# Patient Record
Sex: Female | Born: 1960
Health system: Southern US, Community
[De-identification: ages and names within clinical notes are randomized; demographics above are authoritative.]

## PROBLEM LIST (undated history)

## (undated) DIAGNOSIS — I10 Essential (primary) hypertension: Secondary | ICD-10-CM

## (undated) HISTORY — DX: Essential (primary) hypertension: I10

---

## 2017-11-03 DIAGNOSIS — M84374A Stress fracture, right foot, initial encounter for fracture: Secondary | ICD-10-CM | POA: Diagnosis not present

## 2018-04-24 DIAGNOSIS — R05 Cough: Secondary | ICD-10-CM | POA: Diagnosis not present

## 2018-04-24 DIAGNOSIS — Z720 Tobacco use: Secondary | ICD-10-CM | POA: Diagnosis not present

## 2018-04-24 DIAGNOSIS — I10 Essential (primary) hypertension: Secondary | ICD-10-CM | POA: Diagnosis not present

## 2018-04-24 DIAGNOSIS — M79672 Pain in left foot: Secondary | ICD-10-CM | POA: Diagnosis not present

## 2018-06-16 ENCOUNTER — Other Ambulatory Visit: Payer: Self-pay | Admitting: Internal Medicine

## 2018-06-16 ENCOUNTER — Other Ambulatory Visit (HOSPITAL_COMMUNITY)
Admission: RE | Admit: 2018-06-16 | Discharge: 2018-06-16 | Disposition: A | Discharge status: Home / Self Care | Payer: 59 | Source: Ambulatory Visit | Attending: Internal Medicine | Admitting: Internal Medicine

## 2018-06-16 DIAGNOSIS — Z720 Tobacco use: Secondary | ICD-10-CM | POA: Diagnosis not present

## 2018-06-16 DIAGNOSIS — D7589 Other specified diseases of blood and blood-forming organs: Secondary | ICD-10-CM | POA: Diagnosis not present

## 2018-06-16 DIAGNOSIS — Z0000 Encounter for general adult medical examination without abnormal findings: Secondary | ICD-10-CM | POA: Diagnosis not present

## 2018-06-16 DIAGNOSIS — Z1231 Encounter for screening mammogram for malignant neoplasm of breast: Secondary | ICD-10-CM

## 2018-06-16 DIAGNOSIS — Z01411 Encounter for gynecological examination (general) (routine) with abnormal findings: Secondary | ICD-10-CM | POA: Insufficient documentation

## 2018-06-16 DIAGNOSIS — I10 Essential (primary) hypertension: Secondary | ICD-10-CM | POA: Diagnosis not present

## 2018-06-16 DIAGNOSIS — Z23 Encounter for immunization: Secondary | ICD-10-CM | POA: Diagnosis not present

## 2018-06-17 ENCOUNTER — Other Ambulatory Visit: Payer: Self-pay | Admitting: Otolaryngology

## 2018-06-20 LAB — CYTOLOGY - PAP
DIAGNOSIS: NEGATIVE
HPV: NOT DETECTED

## 2018-07-31 ENCOUNTER — Ambulatory Visit
Admission: RE | Admit: 2018-07-31 | Discharge: 2018-07-31 | Disposition: A | Discharge status: Home / Self Care | Payer: 59 | Source: Ambulatory Visit | Attending: Internal Medicine | Admitting: Internal Medicine

## 2018-07-31 DIAGNOSIS — Z1231 Encounter for screening mammogram for malignant neoplasm of breast: Secondary | ICD-10-CM | POA: Diagnosis not present

## 2019-02-12 DIAGNOSIS — K5730 Diverticulosis of large intestine without perforation or abscess without bleeding: Secondary | ICD-10-CM | POA: Diagnosis not present

## 2019-02-12 DIAGNOSIS — D122 Benign neoplasm of ascending colon: Secondary | ICD-10-CM | POA: Diagnosis not present

## 2019-02-12 DIAGNOSIS — D125 Benign neoplasm of sigmoid colon: Secondary | ICD-10-CM | POA: Diagnosis not present

## 2019-02-12 DIAGNOSIS — D123 Benign neoplasm of transverse colon: Secondary | ICD-10-CM | POA: Diagnosis not present

## 2019-02-12 DIAGNOSIS — K648 Other hemorrhoids: Secondary | ICD-10-CM | POA: Diagnosis not present

## 2019-02-12 DIAGNOSIS — Z1211 Encounter for screening for malignant neoplasm of colon: Secondary | ICD-10-CM | POA: Diagnosis not present

## 2019-02-12 DIAGNOSIS — D124 Benign neoplasm of descending colon: Secondary | ICD-10-CM | POA: Diagnosis not present

## 2019-04-18 IMAGING — MG DIGITAL SCREENING BILATERAL MAMMOGRAM WITH TOMO AND CAD
8 series · 9 of 24 positions shown · non-contrast
Comparison: None.

CLINICAL DATA: Screening.

EXAM:
DIGITAL SCREENING BILATERAL MAMMOGRAM WITH TOMO AND CAD

[L CC synth-2D]
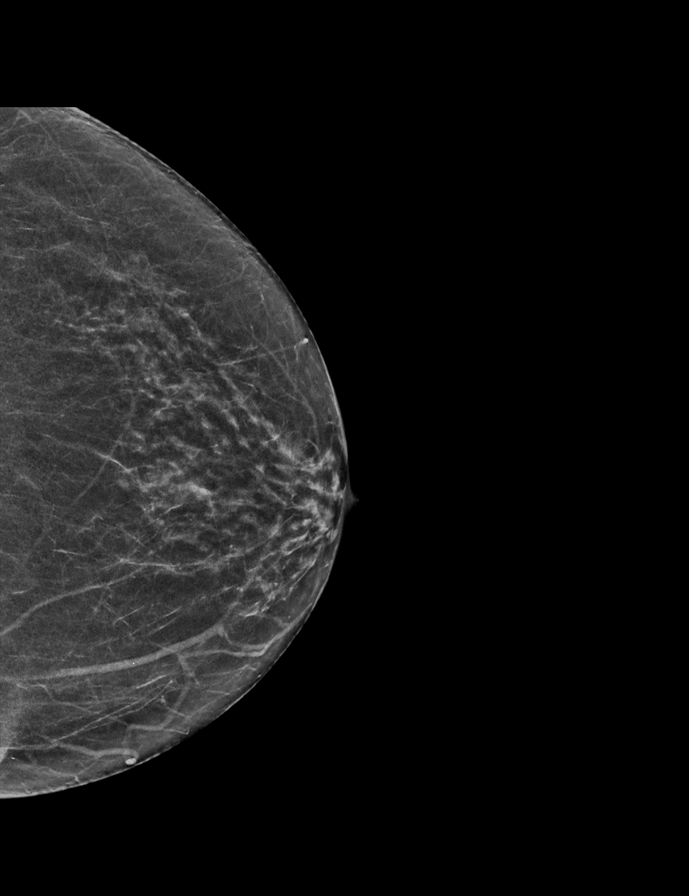

[R CC synth-2D]
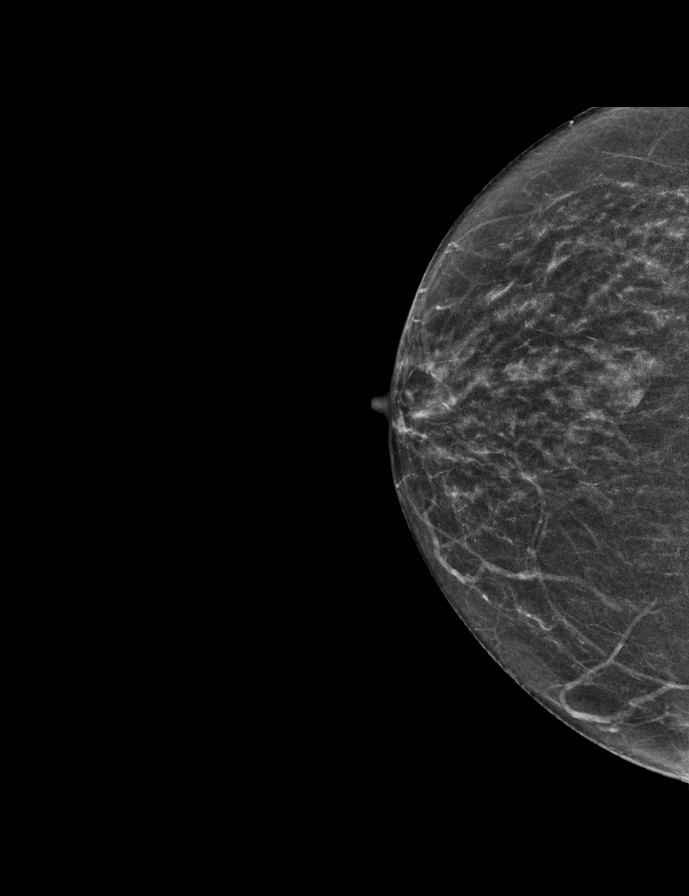

[R MLO synth-2D]
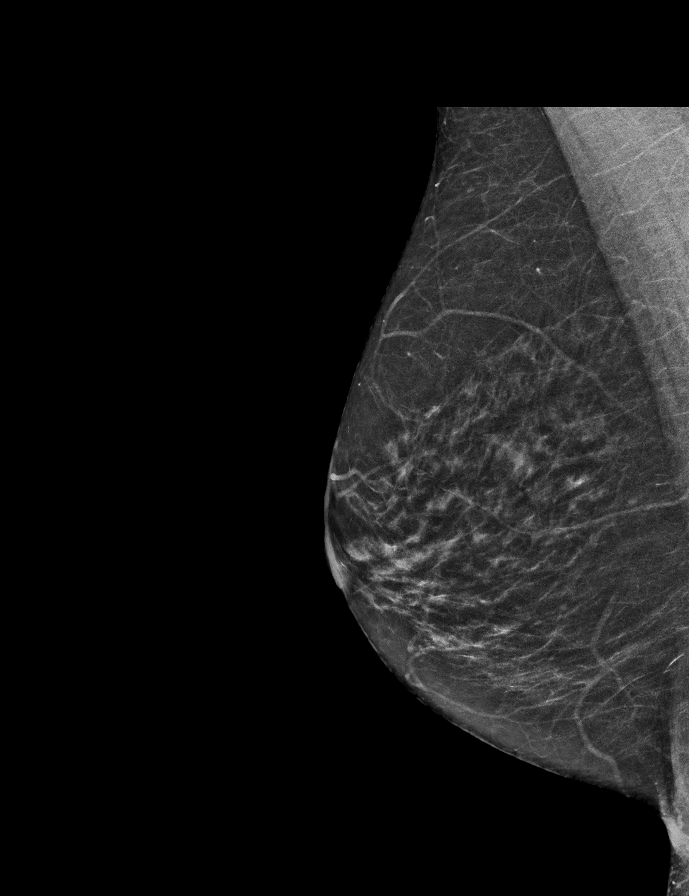

[L MLO synth-2D]
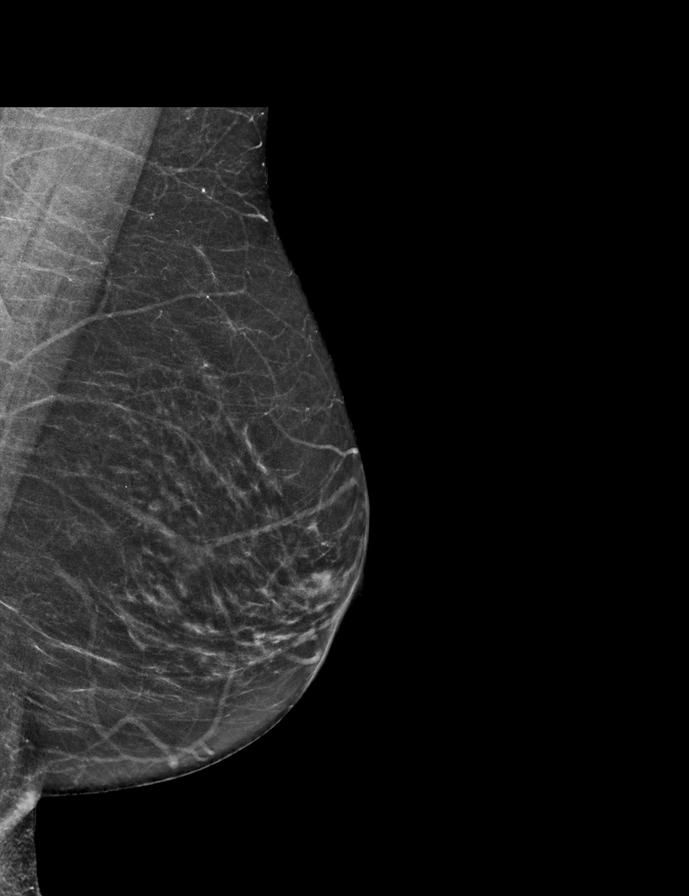

[L MLO tomo · 2 of 56 frames shown]
[frame 19/56]
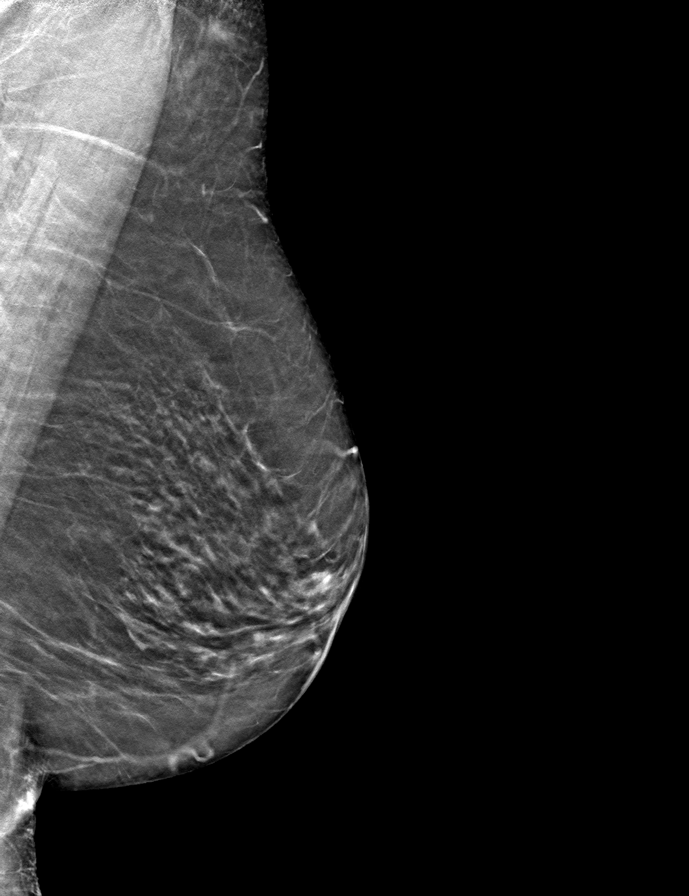
[frame 29/56]
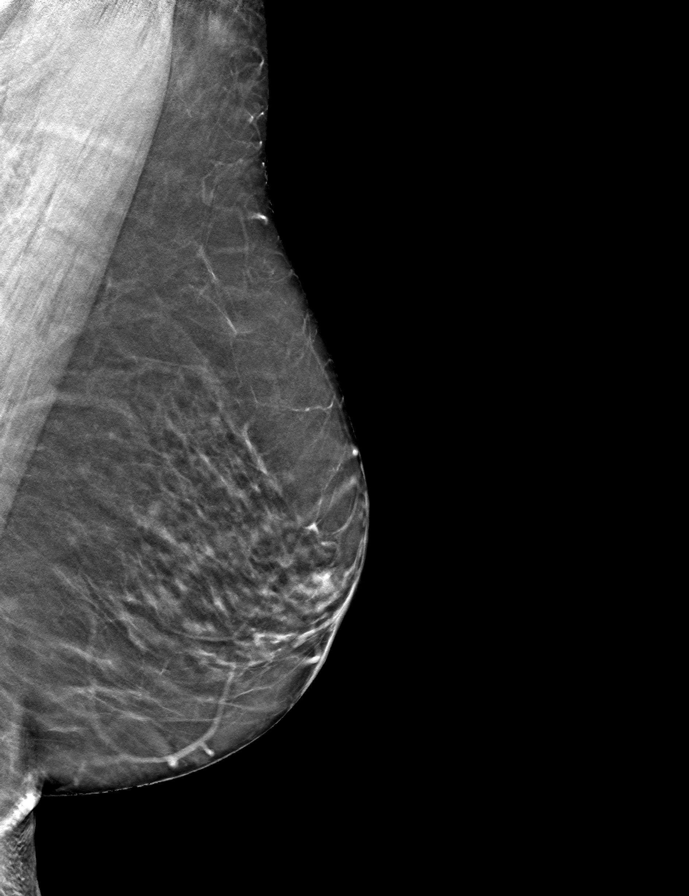

[L CC tomo · tomo slice 27/54.0]
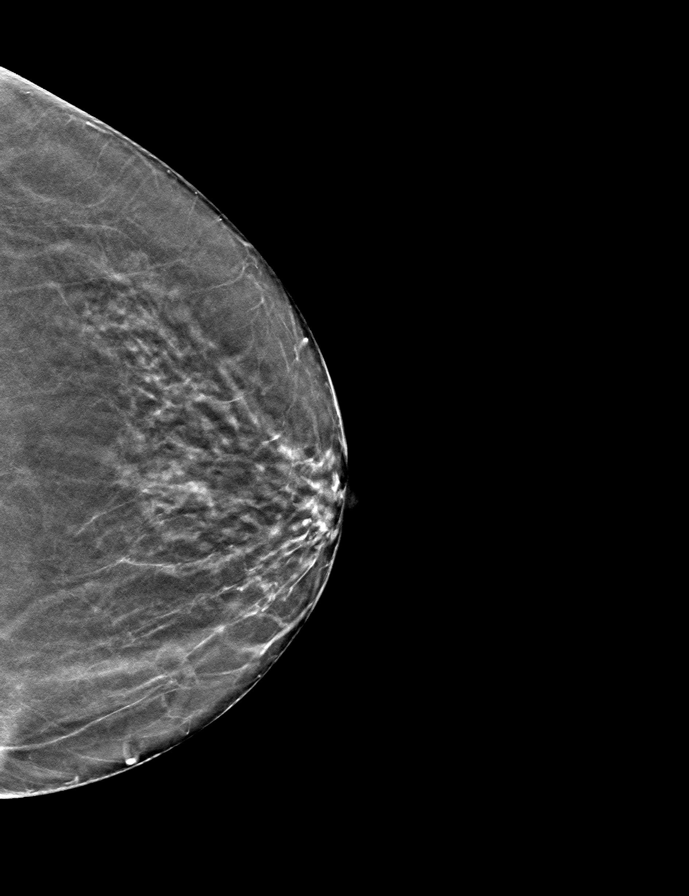

[R CC tomo · tomo slice 26/51.0]
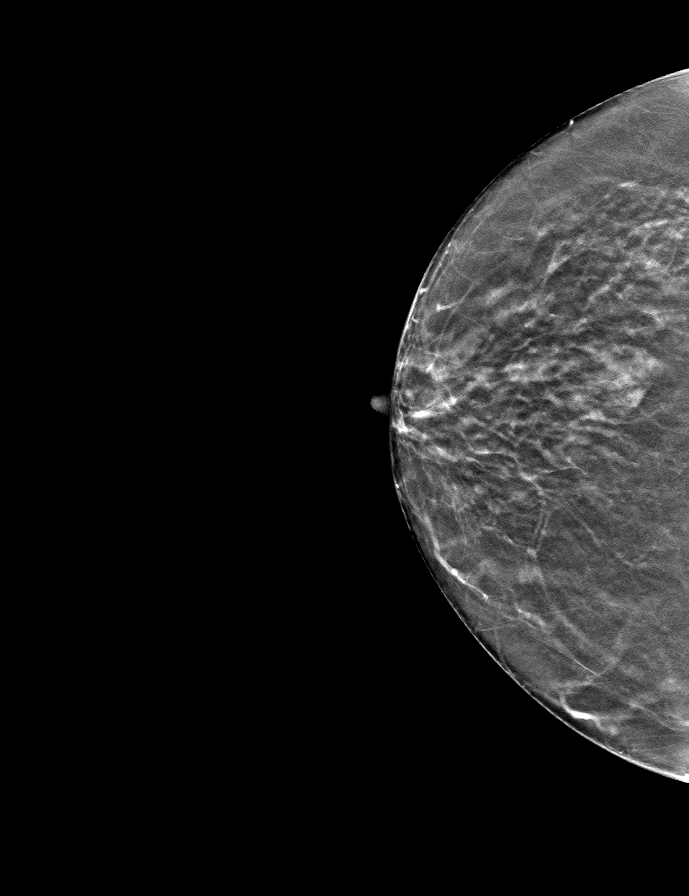

[R MLO tomo · tomo slice 27/53.0]
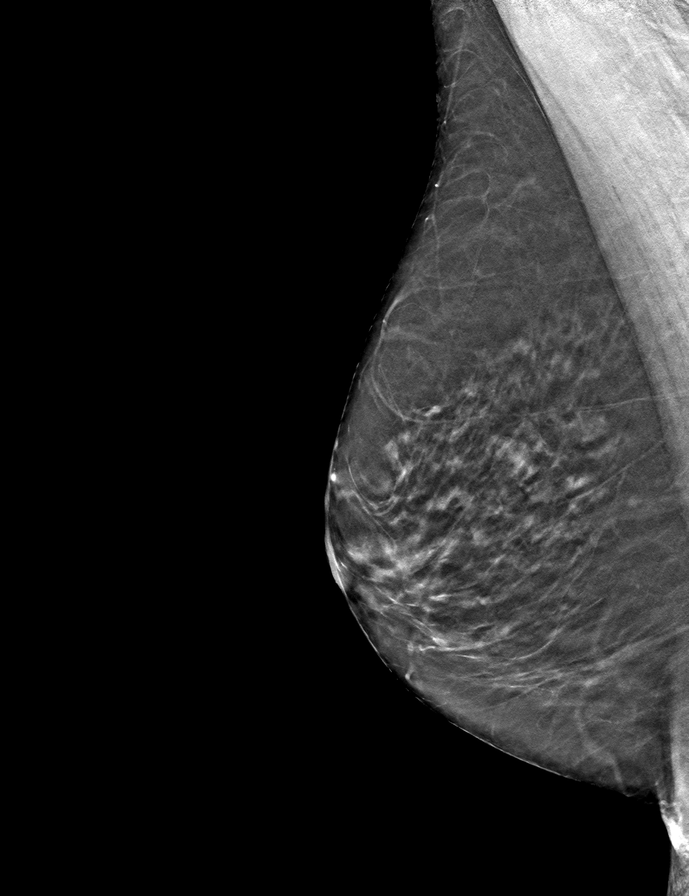

[9 of 24 positions shown; findings below may reference images not displayed]

ACR Breast Density Category b: There are scattered areas of
fibroglandular density.
FINDINGS: There are no findings suspicious for malignancy. Images were
processed with CAD.
IMPRESSION: No mammographic evidence of malignancy. A result letter of this
screening mammogram will be mailed directly to the patient.

RECOMMENDATION:
Screening mammogram in one year. (Code:[id number])

BI-RADS CATEGORY  1: Negative.

## 2019-09-01 DIAGNOSIS — Z20828 Contact with and (suspected) exposure to other viral communicable diseases: Secondary | ICD-10-CM | POA: Diagnosis not present

## 2019-09-01 DIAGNOSIS — U071 COVID-19: Secondary | ICD-10-CM | POA: Diagnosis not present

## 2019-09-01 DIAGNOSIS — Z03818 Encounter for observation for suspected exposure to other biological agents ruled out: Secondary | ICD-10-CM | POA: Diagnosis not present

## 2019-12-15 DIAGNOSIS — L03116 Cellulitis of left lower limb: Secondary | ICD-10-CM | POA: Diagnosis not present

## 2020-02-24 DIAGNOSIS — Z20822 Contact with and (suspected) exposure to covid-19: Secondary | ICD-10-CM | POA: Diagnosis not present

## 2020-02-24 DIAGNOSIS — Z03818 Encounter for observation for suspected exposure to other biological agents ruled out: Secondary | ICD-10-CM | POA: Diagnosis not present

## 2020-04-24 DIAGNOSIS — Z23 Encounter for immunization: Secondary | ICD-10-CM | POA: Diagnosis not present

## 2020-05-16 ENCOUNTER — Other Ambulatory Visit: Payer: Self-pay

## 2020-05-16 ENCOUNTER — Ambulatory Visit (INDEPENDENT_AMBULATORY_CARE_PROVIDER_SITE_OTHER): Payer: BC Managed Care – PPO | Admitting: Family Medicine

## 2020-05-16 ENCOUNTER — Encounter: Payer: Self-pay | Admitting: Family Medicine

## 2020-05-16 ENCOUNTER — Other Ambulatory Visit: Payer: Self-pay | Admitting: Family Medicine

## 2020-05-16 VITALS — BP 113/70 | HR 88 | Temp 98.0°F | Ht 62.0 in | Wt 123.4 lb

## 2020-05-16 DIAGNOSIS — L720 Epidermal cyst: Secondary | ICD-10-CM

## 2020-05-16 DIAGNOSIS — F17200 Nicotine dependence, unspecified, uncomplicated: Secondary | ICD-10-CM

## 2020-05-16 DIAGNOSIS — I10 Essential (primary) hypertension: Secondary | ICD-10-CM | POA: Diagnosis not present

## 2020-05-16 DIAGNOSIS — R739 Hyperglycemia, unspecified: Secondary | ICD-10-CM | POA: Diagnosis not present

## 2020-05-16 DIAGNOSIS — Z13220 Encounter for screening for lipoid disorders: Secondary | ICD-10-CM | POA: Diagnosis not present

## 2020-05-16 DIAGNOSIS — L989 Disorder of the skin and subcutaneous tissue, unspecified: Secondary | ICD-10-CM

## 2020-05-16 DIAGNOSIS — Z0001 Encounter for general adult medical examination with abnormal findings: Secondary | ICD-10-CM

## 2020-05-16 MED ORDER — AMLODIPINE BESYLATE 10 MG PO TABS: 10.0000 mg | ORAL_TABLET | Freq: Every day | ORAL | 3 refills | Status: DC

## 2020-05-16 NOTE — Assessment & Plan Note (Signed)
At goal.  Continue amlodipine 10 mg daily.  Check CBC, CMET, TSH.

## 2020-05-16 NOTE — Patient Instructions (Signed)
It was very nice to see you today!  We will check blood work today.  We biopsied the spot on your chest today.  This should come back within the next week or so.  I will refill your blood pressure medication.  I will see back in year for your next physical.  Please come back to see me sooner if needed.  Take care, Dr Jerline Pain  Please try these tips to maintain a healthy lifestyle:   Eat at least 3 REAL meals and 1-2 snacks per day.  Aim for no more than 5 hours between eating.  If you eat breakfast, please do so within one hour of getting up.    Each meal should contain half fruits/vegetables, one quarter protein, and one quarter carbs (no bigger than a computer mouse)   Cut down on sweet beverages. This includes juice, soda, and sweet tea.     Drink at least 1 glass of water with each meal and aim for at least 8 glasses per day   Exercise at least 150 minutes every week.    Preventive Care 50-69 Years Old, Female Preventive care refers to visits with your health care provider and lifestyle choices that can promote health and wellness. This includes:  A yearly physical exam. This may also be called an annual well check.  Regular dental visits and eye exams.  Immunizations.  Screening for certain conditions.  Healthy lifestyle choices, such as eating a healthy diet, getting regular exercise, not using drugs or products that contain nicotine and tobacco, and limiting alcohol use. What can I expect for my preventive care visit? Physical exam Your health care provider will check your:  Height and weight. This may be used to calculate body mass index (BMI), which tells if you are at a healthy weight.  Heart rate and blood pressure.  Skin for abnormal spots. Counseling Your health care provider may ask you questions about your:  Alcohol, tobacco, and drug use.  Emotional well-being.  Home and relationship well-being.  Sexual activity.  Eating habits.  Work  and work Statistician.  Method of birth control.  Menstrual cycle.  Pregnancy history. What immunizations do I need?  Influenza (flu) vaccine  This is recommended every year. Tetanus, diphtheria, and pertussis (Tdap) vaccine  You may need a Td booster every 10 years. Varicella (chickenpox) vaccine  You may need this if you have not been vaccinated. Zoster (shingles) vaccine  You may need this after age 55. Measles, mumps, and rubella (MMR) vaccine  You may need at least one dose of MMR if you were born in 1957 or later. You may also need a second dose. Pneumococcal conjugate (PCV13) vaccine  You may need this if you have certain conditions and were not previously vaccinated. Pneumococcal polysaccharide (PPSV23) vaccine  You may need one or two doses if you smoke cigarettes or if you have certain conditions. Meningococcal conjugate (MenACWY) vaccine  You may need this if you have certain conditions. Hepatitis A vaccine  You may need this if you have certain conditions or if you travel or work in places where you may be exposed to hepatitis A. Hepatitis B vaccine  You may need this if you have certain conditions or if you travel or work in places where you may be exposed to hepatitis B. Haemophilus influenzae type b (Hib) vaccine  You may need this if you have certain conditions. Human papillomavirus (HPV) vaccine  If recommended by your health care provider, you may need  three doses over 6 months. You may receive vaccines as individual doses or as more than one vaccine together in one shot (combination vaccines). Talk with your health care provider about the risks and benefits of combination vaccines. What tests do I need? Blood tests  Lipid and cholesterol levels. These may be checked every 5 years, or more frequently if you are over 54 years old.  Hepatitis C test.  Hepatitis B test. Screening  Lung cancer screening. You may have this screening every year  starting at age 62 if you have a 30-pack-year history of smoking and currently smoke or have quit within the past 15 years.  Colorectal cancer screening. All adults should have this screening starting at age 15 and continuing until age 93. Your health care provider may recommend screening at age 73 if you are at increased risk. You will have tests every 1-10 years, depending on your results and the type of screening test.  Diabetes screening. This is done by checking your blood sugar (glucose) after you have not eaten for a while (fasting). You may have this done every 1-3 years.  Mammogram. This may be done every 1-2 years. Talk with your health care provider about when you should start having regular mammograms. This may depend on whether you have a family history of breast cancer.  BRCA-related cancer screening. This may be done if you have a family history of breast, ovarian, tubal, or peritoneal cancers.  Pelvic exam and Pap test. This may be done every 3 years starting at age 40. Starting at age 60, this may be done every 5 years if you have a Pap test in combination with an HPV test. Other tests  Sexually transmitted disease (STD) testing.  Bone density scan. This is done to screen for osteoporosis. You may have this scan if you are at high risk for osteoporosis. Follow these instructions at home: Eating and drinking  Eat a diet that includes fresh fruits and vegetables, whole grains, lean protein, and low-fat dairy.  Take vitamin and mineral supplements as recommended by your health care provider.  Do not drink alcohol if: ? Your health care provider tells you not to drink. ? You are pregnant, may be pregnant, or are planning to become pregnant.  If you drink alcohol: ? Limit how much you have to 0-1 drink a day. ? Be aware of how much alcohol is in your drink. In the [place] one drink equals one 12 oz bottle of beer (355 mL), one 5 oz glass of wine (148 mL), or one 1 oz glass of  hard liquor (44 mL). Lifestyle  Take daily care of your teeth and gums.  Stay active. Exercise for at least 30 minutes on 5 or more days each week.  Do not use any products that contain nicotine or tobacco, such as cigarettes, e-cigarettes, and chewing tobacco. If you need help quitting, ask your health care provider.  If you are sexually active, practice safe sex. Use a condom or other form of birth control (contraception) in order to prevent pregnancy and STIs (sexually transmitted infections).  If told by your health care provider, take low-dose aspirin daily starting at age 9. What's next?  Visit your health care provider once a year for a well check visit.  Ask your health care provider how often you should have your eyes and teeth checked.  Stay up to date on all vaccines. This information is not intended to replace advice given to you by your  health care provider. Make sure you discuss any questions you have with your health care provider. Document Revised: 03/20/2018 Document Reviewed: 03/20/2018 Elsevier Patient Education  [place]   Tissue Adhesive Wound Care Some cuts and wounds can be closed with skin glue (tissue adhesive). Skin glue holds the skin together and helps your wound heal faster. Skin glue goes away on its own as your wound gets better. Follow these instructions at home:  Wound care  Showers are allowed 24 hours after treatment. Do not soak the wound in water. Do not take baths, swim, or use hot tubs. Do not use soaps or creams on your wound.  If a bandage (dressing) was put on the wound: ? Wash your hands with soap and water before you change your bandage. ? Change the bandage as often as told by your doctor. ? Leave skin glue in place. It will fall off on its own after 7-10 days. ? Keep the bandage dry.  Do not scratch, rub, or pick at the skin glue.  Do not put tape over the skin glue. The skin glue could come off when you take the tape  off.  Protect the wound from another injury.  Protect the wound from sun and tanning beds. General instructions  Take over-the-counter and prescription medicines only as told by your doctor.  Keep all follow-up visits as told by your doctor. This is important. Get help right away if:  Your wound is red, puffy (swollen), hot, or tender.  You get a rash after the glue is put on.  You have more pain in the wound.  You have a red streak going away from the wound.  You have yellowish-white fluid (pus) coming from the wound.  You have more bleeding.  You have a fever.  You have chills and you start to shake.  You notice a bad smell coming from the wound.  Your wound or skin glue breaks open. This information is not intended to replace advice given to you by your health care provider. Make sure you discuss any questions you have with your health care provider. Document Revised: 06/21/2017 Document Reviewed: 06/01/2016 Elsevier Patient Education  2020 [employer].

## 2020-05-16 NOTE — Progress Notes (Signed)
Chief Complaint:  Christina Hendricks is a 59 y.o. female who presents today for her annual comprehensive physical exam.  She is a new patient.   Assessment/Plan:  New/Acute Problems: Suspicious Lesion Possibly sebaceous cyst however cannot rule out melanoma or other pathologies.  Punch biopsy performed today.  See below procedure note.  She tolerated well.  Chronic Problems Addressed Today: Nicotine dependence with current use Patient was asked about her tobacco use today and was strongly advised to quit. Patient is currently noncontemplative. We reviewed treatment options to assist her quit smoking including NRT, Chantix, and Bupropion. Follow up at next office visit.   Total time spent counseling approximately 3 minutes.    Essential hypertension At goal.  Continue amlodipine 10 mg daily.  Check CBC, CMET, TSH.  Preventative Healthcare: Up-to-date on flu vaccine.  Had colonoscopy last year due for again in 2025.  Due for next Pap in 2024.  Check labs including CBC, CMET, TSH, lipid panel.  Patient Counseling(The following topics were reviewed and/or handout was given):  -Nutrition: Stressed importance of moderation in sodium/caffeine intake, saturated fat and cholesterol, caloric balance, sufficient intake of fresh fruits, vegetables, and fiber.  -Stressed the importance of regular exercise.   -Substance Abuse: Discussed cessation/primary prevention of tobacco, alcohol, or other drug use; driving or other dangerous activities under the influence; availability of treatment for abuse.   -Injury prevention: Discussed safety belts, safety helmets, smoke detector, smoking near bedding or upholstery.   -Sexuality: Discussed sexually transmitted diseases, partner selection, use of condoms, avoidance of unintended pregnancy and contraceptive alternatives.   -Dental health: Discussed importance of regular tooth brushing, flossing, and dental visits.  -Health maintenance and immunizations  reviewed. Please refer to Health maintenance section.  Return to care in 1 year for next preventative visit.     Subjective:  HPI:  She has no acute complaints today.   She has a skin lesion on her left chest that she would like to have biopsied. It has been there for months and seems to be getting bigger.    Lifestyle Diet: None specific eating plans.  Exercise: Very busy at work.   Depression screen [facility] 2/9 05/16/2020  Decreased Interest 0  Down, Depressed, Hopeless 0  PHQ - 2 Score 0  Altered sleeping 0  Tired, decreased energy 0  Change in appetite 0  Feeling bad or failure about yourself  0  Trouble concentrating 0  Moving slowly or fidgety/restless 0  Suicidal thoughts 0  PHQ-9 Score 0  Difficult doing work/chores Not difficult at all    Health Maintenance Due  Topic Date Due  . Hepatitis C Screening  Never done  . HIV Screening  Never done     ROS: Per HPI, otherwise a complete review of systems was negative.   PMH:  The following were reviewed and entered/updated in epic: Past Medical History:  Diagnosis Date  . Hypertension    Patient Active Problem List   Diagnosis Date Noted  . Essential hypertension 05/16/2020  . Nicotine dependence with current use 05/16/2020   History reviewed. No pertinent surgical history.  Family History  Problem Relation Age of Onset  . Breast cancer Neg Hx     Medications- reviewed and updated Current Outpatient Medications  Medication Sig Dispense Refill  . amLODipine (NORVASC) 10 MG tablet Take 1 tablet (10 mg total) by mouth daily. 90 tablet 3   No current facility-administered medications for this visit.    Allergies-reviewed and updated Not on File  Social History   Socioeconomic History  . Marital status: Divorced    Spouse name: Not on file  . Number of children: Not on file  . Years of education: Not on file  . Highest education level: Not on file  Occupational History  . Not on file  Tobacco  Use  . Smoking status: Current Some Day Smoker    Types: Cigarettes  Substance and Sexual Activity  . Alcohol use: Yes    Alcohol/week: 7.0 standard drinks    Types: 7 Glasses of wine per week  . Drug use: Not Currently    Types: Marijuana  . Sexual activity: Not on file  Other Topics Concern  . Not on file  Social History Narrative  . Not on file   Social Determinants of Health   Financial Resource Strain:   . Difficulty of Paying Living Expenses: Not on file  Food Insecurity:   . Worried About Programme researcher, broadcasting/film/video in the Last Year: Not on file  . Ran Out of Food in the Last Year: Not on file  Transportation Needs:   . Lack of Transportation (Medical): Not on file  . Lack of Transportation (Non-Medical): Not on file  Physical Activity:   . Days of Exercise per Week: Not on file  . Minutes of Exercise per Session: Not on file  Stress:   . Feeling of Stress : Not on file  Social Connections:   . Frequency of Communication with Friends and Family: Not on file  . Frequency of Social Gatherings with Friends and Family: Not on file  . Attends Religious Services: Not on file  . Active Member of Clubs or Organizations: Not on file  . Attends Banker Meetings: Not on file  . Marital Status: Not on file        Objective:  Physical Exam: BP 113/70   Pulse 88   Temp 98 F (36.7 C) (Temporal)   Ht 5\' 2"  (1.575 m)   Wt 123 lb 6.4 oz (56 kg)   SpO2 99%   BMI 22.57 kg/m   Body mass index is 22.57 kg/m. Wt Readings from Last 3 Encounters:  05/16/20 123 lb 6.4 oz (56 kg)   Gen: NAD, resting comfortably HEENT: TMs normal bilaterally. OP clear. No thyromegaly noted.  CV: RRR with no murmurs appreciated Pulm: NWOB, CTAB with no crackles, wheezes, or rhonchi GI: Normal bowel sounds present. Soft, Nontender, Nondistended. MSK: no edema, cyanosis, or clubbing noted Skin: warm, dry.  Pigmented lesion approximately 2 mm in diameter on left anterior chest. Neuro:  CN2-12 grossly intact. Strength 5/5 in upper and lower extremities. Reflexes symmetric and intact bilaterally.  Psych: Normal affect and thought content  Punch Biopsy Procedure Note  Pre-operative Diagnosis: Suspicious lesion  Post-operative Diagnosis: same  Locations: Left upper chest  Indications: Diagnostic   Anesthesia: 1% lidocaine with epi  Procedure Details  History of allergy to iodine: no Patient informed of the risks (including bleeding and infection) and benefits of the  procedure and Verbal informed consent obtained.  The lesion and surrounding area was given a sterile prep using betadyne and draped in the usual sterile fashion. The skin was then stretched perpendicular to the skin tension lines and the lesion removed using the 56mm punch. The resulting ellipse was then closed with tissue adhesive. Antibiotic ointment and a sterile dressing applied. The specimen was sent for pathologic examination. The patient tolerated the procedure well.  EBL: 3 ml  Condition: Stable  Complications: none.  Plan: 1. Instructed to keep the wound dry and covered for 24-48h and clean thereafter. 2. Warning signs of infection were reviewed.   3. Recommended that the patient use OTC ibuprofen as needed for pain.      Katina Degree. Jimmey Ralph, MD 05/16/2020 10:12 AM

## 2020-05-16 NOTE — Assessment & Plan Note (Signed)
Patient was asked about her tobacco use today and was strongly advised to quit. Patient is currently non contemplative. We reviewed treatment options to assist her quit smoking including NRT, Chantix, and Bupropion. Follow up at next office visit.   Total time spent counseling approximately 3 minutes.   

## 2020-05-17 LAB — COMPREHENSIVE METABOLIC PANEL
AG Ratio: 2 (calc) (ref 1.0–2.5)
ALT: 20 U/L (ref 6–29)
AST: 24 U/L (ref 10–35)
Albumin: 4.8 g/dL (ref 3.6–5.1)
Alkaline phosphatase (APISO): 85 U/L (ref 37–153)
BUN: 18 mg/dL (ref 7–25)
CO2: 25 mmol/L (ref 20–32)
Calcium: 9.8 mg/dL (ref 8.6–10.4)
Chloride: 104 mmol/L (ref 98–110)
Creat: 0.58 mg/dL (ref 0.50–1.05)
Globulin: 2.4 g/dL (calc) (ref 1.9–3.7)
Glucose, Bld: 89 mg/dL (ref 65–99)
Potassium: 4.2 mmol/L (ref 3.5–5.3)
Sodium: 138 mmol/L (ref 135–146)
Total Bilirubin: 0.5 mg/dL (ref 0.2–1.2)
Total Protein: 7.2 g/dL (ref 6.1–8.1)

## 2020-05-17 LAB — HEMOGLOBIN A1C
Hgb A1c MFr Bld: 5.4 % of total Hgb (ref <5.7)
Mean Plasma Glucose: 108 (calc)
eAG (mmol/L): 6 (calc)

## 2020-05-17 LAB — LIPID PANEL
Cholesterol: 216 mg/dL — ABNORMAL HIGH (ref <200)
HDL: 70 mg/dL (ref >=50)
LDL Cholesterol (Calc): 110 mg/dL (calc) — ABNORMAL HIGH
Non-HDL Cholesterol (Calc): 146 mg/dL (calc) — ABNORMAL HIGH (ref <130)
Total CHOL/HDL Ratio: 3.1 (calc) (ref <5.0)
Triglycerides: 239 mg/dL — ABNORMAL HIGH (ref <150)

## 2020-05-17 LAB — CBC
HCT: 41.2 % (ref 35.0–45.0)
Hemoglobin: 14.2 g/dL (ref 11.7–15.5)
MCH: 33 pg (ref 27.0–33.0)
MCHC: 34.5 g/dL (ref 32.0–36.0)
MCV: 95.8 fL (ref 80.0–100.0)
MPV: 11 fL (ref 7.5–12.5)
Platelets: 301 10*3/uL (ref 140–400)
RBC: 4.3 10*6/uL (ref 3.80–5.10)
RDW: 12.6 % (ref 11.0–15.0)
WBC: 7.4 10*3/uL (ref 3.8–10.8)

## 2020-05-17 LAB — TSH: TSH: 0.6 mIU/L (ref 0.40–4.50)

## 2020-05-19 ENCOUNTER — Encounter: Payer: Self-pay | Admitting: Family Medicine

## 2020-05-19 DIAGNOSIS — E785 Hyperlipidemia, unspecified: Secondary | ICD-10-CM | POA: Insufficient documentation

## 2020-05-19 NOTE — Progress Notes (Signed)
Please inform patient of the following:  Cholesterol is borderline but everything else is normal.  Would like her to continue working on diet and exercise and we can recheck in year.  Still waiting on pathology reports.  Can we check with labs regarding this.

## 2020-05-20 ENCOUNTER — Telehealth: Payer: Self-pay

## 2020-05-20 NOTE — Progress Notes (Signed)
Please inform patient of the following:  Path report showed benign cyst. Do not need to do any further testing.  Katina Degree. Jimmey Ralph, MD 05/20/2020 8:01 AM

## 2020-05-20 NOTE — Telephone Encounter (Signed)
Unaware of how to add comments to lab notes. However, lab result comments from Dr. Jimmey Ralph have been relayed verbatim to pt. Pt verbalized understanding.

## 2020-11-18 ENCOUNTER — Telehealth (INDEPENDENT_AMBULATORY_CARE_PROVIDER_SITE_OTHER): Payer: BC Managed Care – PPO | Admitting: Family Medicine

## 2020-11-18 ENCOUNTER — Encounter: Payer: Self-pay | Admitting: Family Medicine

## 2020-11-18 DIAGNOSIS — F419 Anxiety disorder, unspecified: Secondary | ICD-10-CM

## 2020-11-18 MED ORDER — CLONAZEPAM 0.5 MG PO TABS: 0.5000 mg | ORAL_TABLET | Freq: Two times a day (BID) | ORAL | 0 refills | Status: DC | PRN

## 2020-11-18 NOTE — Assessment & Plan Note (Signed)
Had lengthy discussion with patient regarding her anxiety today.  Symptoms are not currently controlled.  Given the relatively short-term nature of her stressors including upcoming overseas flight I think would be reasonable to have a very limited short-term course of clonazepam as this is worked well for her in the past.  We could consider SSRI if however would expect this to take at least a few weeks to see much of an effect and by that point her stressful events would be past.  I will refill clonazepam today.  She will check in with me in a couple weeks via MyChart.  Patient understands that her clonazepam would not be a long-term prescription.

## 2020-11-18 NOTE — Progress Notes (Signed)
   Christina Hendricks is a 60 y.o. female who presents today for a virtual office visit.  Assessment/Plan:  Chronic Problems Addressed Today: Anxiety Had lengthy discussion with patient regarding her anxiety today.  Symptoms are not currently controlled.  Given the relatively short-term nature of her stressors including upcoming overseas flight I think would be reasonable to have a very limited short-term course of clonazepam as this is worked well for her in the past.  We could consider SSRI if however would expect this to take at least a few weeks to see much of an effect and by that point her stressful events would be past.  I will refill clonazepam today.  She will check in with me in a couple weeks via MyChart.  Patient understands that her clonazepam would not be a long-term prescription.     Subjective:  HPI:  Patient here to discuss anxiety.  She has been under more stress recently.  She will be traveling overseas over the next couple of weeks and she is anxious about this.  She has also been dealing with a declining health of her father who is currently in hospice.  She has had issues with anxiety and panic attacks in the past.  A  few years ago she was given prescription for clonazepam which seemed to work well.  She is interested in getting another prescription today.  She thinks that once she gets through the stressful events coming up that she will be better off.  No reported SI or HI.       Objective/Observations  Physical Exam: Gen: NAD, resting comfortably Pulm: Normal work of breathing Neuro: Grossly normal, moves all extremities Psych: Normal affect and thought content  Virtual Visit via Video   I connected with Christina Hendricks on 11/18/20 at  4:00 PM EDT by a video enabled telemedicine application and verified that I am speaking with the correct person using two identifiers. The limitations of evaluation and management by telemedicine and the availability of in person  appointments were discussed. The patient expressed understanding and agreed to proceed.   Patient location: Home Provider location: [place] [employer] Persons participating in the virtual visit: Myself and Patient     Katina Degree. Jimmey Ralph, MD 11/18/2020 3:22 PM

## 2021-05-08 ENCOUNTER — Other Ambulatory Visit: Payer: Self-pay | Admitting: Family Medicine

## 2021-08-01 ENCOUNTER — Other Ambulatory Visit: Payer: Self-pay | Admitting: Family Medicine

## 2021-10-27 ENCOUNTER — Other Ambulatory Visit: Payer: Self-pay | Admitting: Family Medicine

## 2021-11-22 ENCOUNTER — Other Ambulatory Visit: Payer: Self-pay | Admitting: Family Medicine

## 2021-12-16 ENCOUNTER — Other Ambulatory Visit: Payer: Self-pay | Admitting: Family Medicine

## 2021-12-19 ENCOUNTER — Telehealth: Payer: Self-pay | Admitting: *Deleted

## 2021-12-19 ENCOUNTER — Telehealth: Payer: Self-pay | Admitting: Family Medicine

## 2021-12-19 ENCOUNTER — Other Ambulatory Visit: Payer: Self-pay | Admitting: *Deleted

## 2021-12-19 ENCOUNTER — Encounter: Payer: Self-pay | Admitting: Family Medicine

## 2021-12-19 ENCOUNTER — Ambulatory Visit: Payer: BC Managed Care – PPO | Admitting: Family Medicine

## 2021-12-19 VITALS — BP 130/70 | HR 87 | Temp 98.0°F | Ht 62.0 in | Wt 127.4 lb

## 2021-12-19 DIAGNOSIS — J40 Bronchitis, not specified as acute or chronic: Secondary | ICD-10-CM

## 2021-12-19 MED ORDER — AMOXICILLIN-POT CLAVULANATE 875-125 MG PO TABS: 1.0000 | ORAL_TABLET | Freq: Two times a day (BID) | ORAL | 0 refills | Status: DC

## 2021-12-19 MED ORDER — BENZONATATE 100 MG PO CAPS: 100.0000 mg | ORAL_CAPSULE | Freq: Three times a day (TID) | ORAL | 0 refills | Status: DC | PRN

## 2021-12-19 MED ORDER — AMLODIPINE BESYLATE 10 MG PO TABS: 10.0000 mg | ORAL_TABLET | Freq: Every day | ORAL | 0 refills | Status: DC

## 2021-12-19 MED ORDER — PREDNISONE 20 MG PO TABS: 40.0000 mg | ORAL_TABLET | Freq: Every day | ORAL | 0 refills | Status: AC

## 2021-12-19 NOTE — Patient Instructions (Signed)
It was very nice to see you today!  Augmentin-finish all of it-antibiotic Prednisone-steroids-for inflammation/cough-can stop whenever. Tessalon perles-cough.  Can continue over the counter stuff as well.    PLEASE NOTE:  If you had any lab tests please let us know if you have not heard back within a few days. You may see your results on MyChart before we have a chance to review them but we will give you a call once they are reviewed by [place]. If we ordered any referrals today, please let us know if you have not heard from their office within the next week.   Please try these tips to maintain a healthy lifestyle:  Eat most of your calories during the day when you are active. Eliminate processed foods including packaged sweets (pies, cakes, cookies), reduce intake of potatoes, white bread, white pasta, and white rice. Look for whole grain options, oat flour or almond flour.  Each meal should contain half fruits/vegetables, one quarter protein, and one quarter carbs (no bigger than a computer mouse).  Cut down on sweet beverages. This includes juice, soda, and sweet tea. Also watch fruit intake, though this is a healthier sweet option, it still contains natural sugar! Limit to 3 servings daily.  Drink at least 1 glass of water with each meal and aim for at least 8 glasses per day  Exercise at least 150 minutes every week.

## 2021-12-19 NOTE — Telephone Encounter (Signed)
Patient sch with [facility] 5/30    Patient Name: CLOTILDA HAFER [facility] Gender: Female DOB: [birth year] Age: 61 Y 3 M 24 D Return Phone Number: [phone] (Primary) Address: City/ State/ Zip: [place]  [address] Client [facility] Night - Human resources officer [facility] Night Provider Jacquiline Doe- MD Contact Type Call Who Is Calling Patient / Member / Family / Caregiver Call Type Triage / Clinical Relationship To Patient Self Return Phone Number [phone] (Primary) Chief Complaint Cough Reason for Call Symptomatic / Request for Health Information Initial Comment Caller states she has been sick for 2 weeks. It is not COVID, she has taken 2 tests and they are negative. She has a non productive cough and a sore throat. She has head congestion. Translation No Nurse Assessment Nurse: Henri Medal, RN, Amy Date/Time (Eastern Time): 12/19/2021 8:02:20 AM Confirm and document reason for call. If symptomatic, describe symptoms. ---Caller states she has been sick for 2 weeks. It is not COVID, she has taken 2 tests and they are negative. She has a non productive cough and a sore throat. She has head congestion. The cough gets so bad sometimes that she can't stop it & it brings tears to her eyes. Her ears are blocked & crackling. Throat is really dry & really sore. No fever. Does the patient have any new or worsening symptoms? ---Yes Will a triage be completed? ---Yes Related visit to physician within the last 2 weeks? ---No Does the PT have any chronic conditions? (i.e. diabetes, asthma, this includes High risk factors for pregnancy, etc.) ---No Is this a behavioral health or substance abuse call? ---No Guidelines Guideline Title Affirmed Question Affirmed Notes Nurse Date/Time (Eastern Time) Cough - Acute NonProductive [1] Nasal discharge AND [2] present > 10 days Lovelace, RN, Amy 12/19/2021 8:04:24 AM PLEASE NOTE: All  timestamps contained within this report are represented as [place] Standard Time. CONFIDENTIALTY NOTICE: This fax transmission is intended only for the addressee. It contains information that is legally privileged, confidential or otherwise protected from use or disclosure. If you are not the intended recipient, you are strictly prohibited from reviewing, disclosing, copying using or disseminating any of this information or taking any action in reliance on or regarding this information. If you have received this fax in error, please notify us immediately by telephone so that we can arrange for its return to [place]. Phone: [phone], Toll-Free: [phone], Fax: [phone] Page: 2 of 2 Call Id: [id number] Disp. Time Lamount Cohen Time) Disposition Final User 12/19/2021 8:08:11 AM SEE PCP WITHIN 3 DAYS Yes Lovelace, RN, Amy Caller Disagree/Comply Comply Caller Understands Yes PreDisposition InappropriateToAsk Care Advice Given Per Guideline SEE PCP WITHIN 3 DAYS: * You need to be seen within 2 or 3 days. * PCP VISIT: Call your doctor (or NP/PA) during regular office hours and make an appointment. A clinic or urgent care center are good places to go for care if your doctor's office is closed or you can't get an appointment. NOTE: If office will be open tomorrow, tell caller to call then, not in 3 days. COUGH MEDICINES: * COUGH SYRUP WITH DEXTROMETHORPHAN: An over-the-counter cough syrup can help your cough. The most common cough suppressant in over-the-counter cough medicines is dextromethorphan. CALL BACK IF: * Fever over 103 F (39.4 C) * Difficulty breathing occurs * You become worse CARE ADVICE given per Cough - Acute Non-Productive (Adult) guideline

## 2021-12-19 NOTE — Telephone Encounter (Signed)
Spoke with patient, notified need OV with PCP for medication refills and F/U hypertension

## 2021-12-19 NOTE — Progress Notes (Signed)
   Subjective:     Patient ID: Christina Hendricks, female    DOB: Aug 19, 1960, 61 y.o.   MRN: [id number]  Chief Complaint  Patient presents with   Cough    Cough x 2 weeks, negative Covid test, non productive, dry at times   Sore Throat    Started 2 weeks ago   Nasal Congestion    Nasal congestion, ear congestion, started 2 weeks Has taking OTC medication for sx      HPI Chief complaint: cough,congestion Symptom onset: 2 wks Pertinent positives: non productive cough x2wks, sore throat, nasal and ear congestion.  Not getting better. Mouth very dry. Pertinent negatives: covid neg x 2, no f/c. No sob Treatments tried: OTC Vaccine status: utd Sick exposure: co-worker similar.    Health Maintenance Due  Topic Date Due   HIV Screening  Never done   Hepatitis C Screening  Never done   MAMMOGRAM  07/31/2020    Past Medical History:  Diagnosis Date   Hypertension     History reviewed. No pertinent surgical history.  Outpatient Medications Prior to Visit  Medication Sig Dispense Refill   amLODipine (NORVASC) 10 MG tablet TAKE 1 TABLET BY MOUTH EVERY DAY 30 tablet 0   clonazePAM (KLONOPIN) 0.5 MG tablet Take 1 tablet (0.5 mg total) by mouth 2 (two) times daily as needed for anxiety. 30 tablet 0   No facility-administered medications prior to visit.    Allergies  Allergen Reactions   Erythromycin    ROS neg/noncontributory except as noted HPI/below      Objective:     BP 130/70   Pulse 87   Temp 98 F (36.7 C) (Temporal)   Ht 5\' 2"  (1.575 m)   Wt 127 lb 6 oz (57.8 kg)   SpO2 97%   BMI 23.30 kg/m  Wt Readings from Last 3 Encounters:  12/19/21 127 lb 6 oz (57.8 kg)  11/18/20 130 lb (59 kg)  05/16/20 123 lb 6.4 oz (56 kg)    Physical Exam   Gen: WDWN NAD HEENT: NCAT, conjunctiva not injected, sclera nonicteric TM WNL B, OP moist, no exudates   sinuses nontender NECK:  supple, no thyromegaly, no nodes, no carotid bruits CARDIAC: RRR, S1S2+, no murmur.   LUNGS: CTAB. No wheezes EXT:  no edema MSK: no gross abnormalities.  NEURO: A&O x3.  CN II-XII intact.  PSYCH: normal mood. Good eye contact     Assessment & Plan:   Problem List Items Addressed This Visit   None Visit Diagnoses     Bronchitis    -  Primary      Bronchitis-acute.  Augmentin 875 bid, pred 40, tessalon perles.  Can continue otc.   Meds ordered this encounter  Medications   amoxicillin-clavulanate (AUGMENTIN) 875-125 MG tablet    Sig: Take 1 tablet by mouth 2 (two) times daily.    Dispense:  20 tablet    Refill:  0   benzonatate (TESSALON PERLES) 100 MG capsule    Sig: Take 1 capsule (100 mg total) by mouth 3 (three) times daily as needed.    Dispense:  20 capsule    Refill:  0   predniSONE (DELTASONE) 20 MG tablet    Sig: Take 2 tablets (40 mg total) by mouth daily with breakfast for 5 days.    Dispense:  10 tablet    Refill:  0    05/18/20, MD

## 2021-12-19 NOTE — Telephone Encounter (Signed)
Noted  

## 2021-12-19 NOTE — Telephone Encounter (Signed)
Patient had an acute visit with Dr. Ruthine Dose today. Patient stated that her pharmacy contacted her and stated that her refill for amlodipine was denied. Patient stated that she know she hasn't seen Dr. Jimmey Ralph in about 2 years, but hadn't had any issues before with getting a refill. Patient would like a refill, she is stated "well I am here today"

## 2021-12-19 NOTE — Telephone Encounter (Signed)
See note

## 2022-01-19 ENCOUNTER — Other Ambulatory Visit: Payer: Self-pay | Admitting: Family Medicine

## 2022-02-16 ENCOUNTER — Other Ambulatory Visit: Payer: Self-pay | Admitting: Family Medicine

## 2022-02-23 ENCOUNTER — Other Ambulatory Visit: Payer: Self-pay | Admitting: Family Medicine

## 2022-02-23 DIAGNOSIS — Z1231 Encounter for screening mammogram for malignant neoplasm of breast: Secondary | ICD-10-CM

## 2022-03-09 ENCOUNTER — Ambulatory Visit
Admission: RE | Admit: 2022-03-09 | Discharge: 2022-03-09 | Disposition: A | Discharge status: Home / Self Care | Payer: BC Managed Care – PPO | Source: Ambulatory Visit | Attending: Family Medicine | Admitting: Family Medicine

## 2022-03-09 DIAGNOSIS — Z1231 Encounter for screening mammogram for malignant neoplasm of breast: Secondary | ICD-10-CM | POA: Diagnosis not present

## 2022-03-13 ENCOUNTER — Encounter: Payer: Self-pay | Admitting: Family Medicine

## 2022-03-13 ENCOUNTER — Ambulatory Visit (INDEPENDENT_AMBULATORY_CARE_PROVIDER_SITE_OTHER): Payer: BC Managed Care – PPO | Admitting: Family Medicine

## 2022-03-13 VITALS — BP 115/68 | HR 78 | Temp 98.0°F | Ht 62.0 in | Wt 127.8 lb

## 2022-03-13 DIAGNOSIS — I10 Essential (primary) hypertension: Secondary | ICD-10-CM

## 2022-03-13 DIAGNOSIS — Z23 Encounter for immunization: Secondary | ICD-10-CM

## 2022-03-13 DIAGNOSIS — R739 Hyperglycemia, unspecified: Secondary | ICD-10-CM | POA: Diagnosis not present

## 2022-03-13 DIAGNOSIS — Z1159 Encounter for screening for other viral diseases: Secondary | ICD-10-CM

## 2022-03-13 DIAGNOSIS — F419 Anxiety disorder, unspecified: Secondary | ICD-10-CM

## 2022-03-13 DIAGNOSIS — Z0001 Encounter for general adult medical examination with abnormal findings: Secondary | ICD-10-CM | POA: Diagnosis not present

## 2022-03-13 DIAGNOSIS — F17200 Nicotine dependence, unspecified, uncomplicated: Secondary | ICD-10-CM | POA: Diagnosis not present

## 2022-03-13 DIAGNOSIS — E785 Hyperlipidemia, unspecified: Secondary | ICD-10-CM | POA: Diagnosis not present

## 2022-03-13 DIAGNOSIS — L578 Other skin changes due to chronic exposure to nonionizing radiation: Secondary | ICD-10-CM

## 2022-03-13 DIAGNOSIS — L570 Actinic keratosis: Secondary | ICD-10-CM

## 2022-03-13 NOTE — Patient Instructions (Signed)
It was very nice to see you today!  We will get your flu shot today.  We froze a few sun damage spots on your skin today.  I will refer you to a dermatologist to take a look at the spot on your nose.  We will check blood work today.  I will refill your amlodipine.  Please let me know if you would like assistance with stopping smoking.  Please continue to work on diet and exercise.  I will see back in year for your next physical.  Please come back to see me sooner if needed.  Take care, Dr Jerline Pain  PLEASE NOTE:  If you had any lab tests please let us know if you have not heard back within a few days. You may see your results on mychart before we have a chance to review them but we will give you a call once they are reviewed by [place]. If we ordered any referrals today, please let us know if you have not heard from their office within the next week.   Please try these tips to maintain a healthy lifestyle:  Eat at least 3 REAL meals and 1-2 snacks per day.  Aim for no more than 5 hours between eating.  If you eat breakfast, please do so within one hour of getting up.   Each meal should contain half fruits/vegetables, one quarter protein, and one quarter carbs (no bigger than a computer mouse)  Cut down on sweet beverages. This includes juice, soda, and sweet tea.   Drink at least 1 glass of water with each meal and aim for at least 8 glasses per day  Exercise at least 150 minutes every week.    Preventive Care 49-42 Years Old, Female Preventive care refers to lifestyle choices and visits with your health care provider that can promote health and wellness. Preventive care visits are also called wellness exams. What can I expect for my preventive care visit? Counseling Your health care provider may ask you questions about your: Medical history, including: Past medical problems. Family medical history. Pregnancy history. Current health, including: Menstrual cycle. Method of birth  control. Emotional well-being. Home life and relationship well-being. Sexual activity and sexual health. Lifestyle, including: Alcohol, nicotine or tobacco, and drug use. Access to firearms. Diet, exercise, and sleep habits. Work and work Statistician. Sunscreen use. Safety issues such as seatbelt and bike helmet use. Physical exam Your health care provider will check your: Height and weight. These may be used to calculate your BMI (body mass index). BMI is a measurement that tells if you are at a healthy weight. Waist circumference. This measures the distance around your waistline. This measurement also tells if you are at a healthy weight and may help predict your risk of certain diseases, such as type 2 diabetes and high blood pressure. Heart rate and blood pressure. Body temperature. Skin for abnormal spots. What immunizations do I need?  Vaccines are usually given at various ages, according to a schedule. Your health care provider will recommend vaccines for you based on your age, medical history, and lifestyle or other factors, such as travel or where you work. What tests do I need? Screening Your health care provider may recommend screening tests for certain conditions. This may include: Lipid and cholesterol levels. Diabetes screening. This is done by checking your blood sugar (glucose) after you have not eaten for a while (fasting). Pelvic exam and Pap test. Hepatitis B test. Hepatitis C test. HIV (human immunodeficiency virus)  test. STI (sexually transmitted infection) testing, if you are at risk. Lung cancer screening. Colorectal cancer screening. Mammogram. Talk with your health care provider about when you should start having regular mammograms. This may depend on whether you have a family history of breast cancer. BRCA-related cancer screening. This may be done if you have a family history of breast, ovarian, tubal, or peritoneal cancers. Bone density scan. This is  done to screen for osteoporosis. Talk with your health care provider about your test results, treatment options, and if necessary, the need for more tests. Follow these instructions at home: Eating and drinking  Eat a diet that includes fresh fruits and vegetables, whole grains, lean protein, and low-fat dairy products. Take vitamin and mineral supplements as recommended by your health care provider. Do not drink alcohol if: Your health care provider tells you not to drink. You are pregnant, may be pregnant, or are planning to become pregnant. If you drink alcohol: Limit how much you have to 0-1 drink a day. Know how much alcohol is in your drink. In the [place] one drink equals one 12 oz bottle of beer (355 mL), one 5 oz glass of wine (148 mL), or one 1 oz glass of hard liquor (44 mL). Lifestyle Brush your teeth every morning and night with fluoride toothpaste. Floss one time each day. Exercise for at least 30 minutes 5 or more days each week. Do not use any products that contain nicotine or tobacco. These products include cigarettes, chewing tobacco, and vaping devices, such as e-cigarettes. If you need help quitting, ask your health care provider. Do not use drugs. If you are sexually active, practice safe sex. Use a condom or other form of protection to prevent STIs. If you do not wish to become pregnant, use a form of birth control. If you plan to become pregnant, see your health care provider for a prepregnancy visit. Take aspirin only as told by your health care provider. Make sure that you understand how much to take and what form to take. Work with your health care provider to find out whether it is safe and beneficial for you to take aspirin daily. Find healthy ways to manage stress, such as: Meditation, yoga, or listening to music. Journaling. Talking to a trusted person. Spending time with friends and family. Minimize exposure to UV radiation to reduce your risk of skin  cancer. Safety Always wear your seat belt while driving or riding in a vehicle. Do not drive: If you have been drinking alcohol. Do not ride with someone who has been drinking. When you are tired or distracted. While texting. If you have been using any mind-altering substances or drugs. Wear a helmet and other protective equipment during sports activities. If you have firearms in your house, make sure you follow all gun safety procedures. Seek help if you have been physically or sexually abused. What's next? Visit your health care provider once a year for an annual wellness visit. Ask your health care provider how often you should have your eyes and teeth checked. Stay up to date on all vaccines. This information is not intended to replace advice given to you by your health care provider. Make sure you discuss any questions you have with your health care provider. Document Revised: 01/04/2021 Document Reviewed: 01/04/2021 Elsevier Patient Education  [place].

## 2022-03-13 NOTE — Assessment & Plan Note (Signed)
Patient was asked about her tobacco use today and was strongly advised to quit. Patient is currently non contemplative. We reviewed treatment options to assist her quit smoking including NRT, Chantix, and Bupropion. Follow up at next office visit.   Total time spent counseling approximately 3 minutes.   

## 2022-03-13 NOTE — Assessment & Plan Note (Signed)
At goal today on amlodipine 10 mg daily. 

## 2022-03-13 NOTE — Assessment & Plan Note (Signed)
Symptoms are overall stable though she does have a lot of anxiety around the death and dying process.  She is interested in seeing a therapist.  We will place referral today.

## 2022-03-13 NOTE — Progress Notes (Signed)
Chief Complaint:  Christina Hendricks is a 61 y.o. female who presents today for her annual comprehensive physical exam.    Assessment/Plan:  New/Acute Problems: Actinic keratosis Cryotherapy applied today.  She tolerated well.  See below procedure note.  Chronic Problems Addressed Today: Sun-damaged skin Cryotherapy applied today to actinic keratosis.  He has a lesion on her left nostril that is concerning for possible basal cell carcinoma.  We will place referral to dermatology for further evaluation.  Anxiety Symptoms are overall stable though she does have a lot of anxiety around the death and dying process.  She is interested in seeing a therapist.  We will place referral today.  Dyslipidemia Check lipids.  Nicotine dependence with current use Patient was asked about her tobacco use today and was strongly advised to quit. Patient is currently non contemplative. We reviewed treatment options to assist her quit smoking including NRT, Chantix, and Bupropion. Follow up at next office visit.   Total time spent counseling approximately 3 minutes.    Essential hypertension At goal today on amlodipine 10 mg daily.  Preventative Healthcare: Check labs.  Flu shot given today.  Patient Counseling(The following topics were reviewed and/or handout was given):  -Nutrition: Stressed importance of moderation in sodium/caffeine intake, saturated fat and cholesterol, caloric balance, sufficient intake of fresh fruits, vegetables, and fiber.  -Stressed the importance of regular exercise.   -Substance Abuse: Discussed cessation/primary prevention of tobacco, alcohol, or other drug use; driving or other dangerous activities under the influence; availability of treatment for abuse.   -Injury prevention: Discussed safety belts, safety helmets, smoke detector, smoking near bedding or upholstery.   -Sexuality: Discussed sexually transmitted diseases, partner selection, use of condoms, avoidance of  unintended pregnancy and contraceptive alternatives.   -Dental health: Discussed importance of regular tooth brushing, flossing, and dental visits.  -Health maintenance and immunizations reviewed. Please refer to Health maintenance section.  Return to care in 1 year for next preventative visit.     Subjective:  HPI:  She has no acute complaints today. See A/p for status of chronic conditions.   Lifestyle Diet: None specific.  Exercise: Very busy with work and gets a lot of activity.      03/13/2022    2:53 PM  Depression screen PHQ 2/9  Decreased Interest 0  Down, Depressed, Hopeless 0  PHQ - 2 Score 0    There are no preventive care reminders to display for this patient.    ROS: Per HPI, otherwise a complete review of systems was negative.   PMH:  The following were reviewed and entered/updated in epic: Past Medical History:  Diagnosis Date   Hypertension    Patient Active Problem List   Diagnosis Date Noted   Sun-damaged skin 03/13/2022   Anxiety 11/18/2020   Dyslipidemia 05/19/2020   Essential hypertension 05/16/2020   Nicotine dependence with current use 05/16/2020   History reviewed. No pertinent surgical history.  Family History  Problem Relation Age of Onset   Breast cancer Neg Hx     Medications- reviewed and updated Current Outpatient Medications  Medication Sig Dispense Refill   amLODipine (NORVASC) 10 MG tablet TAKE 1 TABLET BY MOUTH EVERY DAY 30 tablet 0   No current facility-administered medications for this visit.    Allergies-reviewed and updated Allergies  Allergen Reactions   Erythromycin     Social History   Socioeconomic History   Marital status: Divorced    Spouse name: Not on file   Number of children:  2   Years of education: Not on file   Highest education level: Not on file  Occupational History   Not on file  Tobacco Use   Smoking status: Some Days    Types: Cigarettes   Smokeless tobacco: Not on file  Substance  and Sexual Activity   Alcohol use: Yes    Alcohol/week: 7.0 standard drinks of alcohol    Types: 7 Glasses of wine per week   Drug use: Not Currently    Types: Marijuana   Sexual activity: Not on file  Other Topics Concern   Not on file  Social History Narrative   2 children   Retired Runner, broadcasting/film/video.  [employer]   Social Determinants of Corporate investment banker Strain: Not on file  Food Insecurity: Not on file  Transportation Needs: Not on file  Physical Activity: Not on file  Stress: Not on file  Social Connections: Not on file        Objective:  Physical Exam: BP 115/68   Pulse 78   Temp 98 F (36.7 C) (Temporal)   Ht 5\' 2"  (1.575 m)   Wt 127 lb 12.8 oz (58 kg)   SpO2 98%   BMI 23.37 kg/m   Body mass index is 23.37 kg/m. Wt Readings from Last 3 Encounters:  03/13/22 127 lb 12.8 oz (58 kg)  12/19/21 127 lb 6 oz (57.8 kg)  11/18/20 130 lb (59 kg)   Gen: NAD, resting comfortably HEENT: TMs normal bilaterally. OP clear. No thyromegaly noted.  CV: RRR with no murmurs appreciated Pulm: NWOB, CTAB with no crackles, wheezes, or rhonchi GI: Normal bowel sounds present. Soft, Nontender, Nondistended. MSK: no edema, cyanosis, or clubbing noted Skin: warm, dry.  AK on right hand noted.  Excoriated lesion with rolled borders and telangiectasias on left nostril Neuro: CN2-12 grossly intact. Strength 5/5 in upper and lower extremities. Reflexes symmetric and intact bilaterally.  Psych: Normal affect and thought content  Cryotherapy Procedure Note  Pre-operative Diagnosis: Actinic keratosis  Locations: Right hand  Indications: Therapeutic  Procedure Details  Patient informed of risks (permanent scarring, infection, light or dark discoloration, bleeding, infection, weakness, numbness and recurrence of the lesion) and benefits of the procedure and verbal informed consent obtained.  The areas are treated with liquid nitrogen therapy, frozen until ice ball extended 3 mm  beyond lesion, allowed to thaw, and treated again.  2 lesions were treated.  The patient tolerated procedure well.  The patient was instructed on post-op care, warned that there may be blister formation, redness and pain. Recommend OTC analgesia as needed for pain. See note Condition: Stable  Complications: none.      11/20/20. Katina Degree, MD 03/13/2022 3:23 PM

## 2022-03-13 NOTE — Assessment & Plan Note (Signed)
Cryotherapy applied today to actinic keratosis.  He has a lesion on her left nostril that is concerning for possible basal cell carcinoma.  We will place referral to dermatology for further evaluation.

## 2022-03-13 NOTE — Assessment & Plan Note (Signed)
Check lipids 

## 2022-03-14 LAB — COMPREHENSIVE METABOLIC PANEL
ALT: 19 U/L (ref 0–35)
AST: 22 U/L (ref 0–37)
Albumin: 4.4 g/dL (ref 3.5–5.2)
Alkaline Phosphatase: 69 U/L (ref 39–117)
BUN: 21 mg/dL (ref 6–23)
CO2: 29 mEq/L (ref 19–32)
Calcium: 9.1 mg/dL (ref 8.4–10.5)
Chloride: 102 mEq/L (ref 96–112)
Creatinine, Ser: 1.19 mg/dL (ref 0.40–1.20)
GFR: 50.04 mL/min — ABNORMAL LOW (ref >60.00)
Glucose, Bld: 103 mg/dL — ABNORMAL HIGH (ref 70–99)
Potassium: 4.1 mEq/L (ref 3.5–5.1)
Sodium: 139 mEq/L (ref 135–145)
Total Bilirubin: 0.3 mg/dL (ref 0.2–1.2)
Total Protein: 6.7 g/dL (ref 6.0–8.3)

## 2022-03-14 LAB — TSH: TSH: 0.67 u[IU]/mL (ref 0.35–5.50)

## 2022-03-14 LAB — CBC
HCT: 37.4 % (ref 36.0–46.0)
Hemoglobin: 12.6 g/dL (ref 12.0–15.0)
MCHC: 33.8 g/dL (ref 30.0–36.0)
MCV: 95 fl (ref 78.0–100.0)
Platelets: 281 10*3/uL (ref 150.0–400.0)
RBC: 3.93 Mil/uL (ref 3.87–5.11)
RDW: 13.3 % (ref 11.5–15.5)
WBC: 8.6 10*3/uL (ref 4.0–10.5)

## 2022-03-14 LAB — LIPID PANEL
Cholesterol: 220 mg/dL — ABNORMAL HIGH (ref 0–200)
HDL: 72.6 mg/dL (ref >39.00)
NonHDL: 147.27
Total CHOL/HDL Ratio: 3
Triglycerides: 237 mg/dL — ABNORMAL HIGH (ref 0.0–149.0)
VLDL: 47.4 mg/dL — ABNORMAL HIGH (ref 0.0–40.0)

## 2022-03-14 LAB — LDL CHOLESTEROL, DIRECT: Direct LDL: 115 mg/dL

## 2022-03-14 LAB — HEMOGLOBIN A1C: Hgb A1c MFr Bld: 5.9 % (ref 4.6–6.5)

## 2022-03-14 LAB — HEPATITIS C ANTIBODY: Hepatitis C Ab: NONREACTIVE

## 2022-03-16 NOTE — Progress Notes (Signed)
Please inform patient of the following:  Her cholesterol and blood sugar are both borderline elevated but stable.  She should continue to work on diet and exercise and we can recheck in year.  Do not need to start any medications at this point.

## 2022-05-14 DIAGNOSIS — L570 Actinic keratosis: Secondary | ICD-10-CM | POA: Diagnosis not present

## 2022-05-14 DIAGNOSIS — L819 Disorder of pigmentation, unspecified: Secondary | ICD-10-CM | POA: Diagnosis not present

## 2022-05-14 DIAGNOSIS — L720 Epidermal cyst: Secondary | ICD-10-CM | POA: Diagnosis not present

## 2022-05-14 DIAGNOSIS — L820 Inflamed seborrheic keratosis: Secondary | ICD-10-CM | POA: Diagnosis not present

## 2022-05-14 DIAGNOSIS — L573 Poikiloderma of Civatte: Secondary | ICD-10-CM | POA: Diagnosis not present

## 2022-05-14 DIAGNOSIS — L821 Other seborrheic keratosis: Secondary | ICD-10-CM | POA: Diagnosis not present

## 2022-05-14 DIAGNOSIS — C44311 Basal cell carcinoma of skin of nose: Secondary | ICD-10-CM | POA: Diagnosis not present

## 2022-05-23 ENCOUNTER — Other Ambulatory Visit: Payer: Self-pay | Admitting: Family Medicine

## 2022-05-23 ENCOUNTER — Ambulatory Visit: Payer: BC Managed Care – PPO | Admitting: Sports Medicine

## 2022-05-23 VITALS — BP 110/68 | HR 76 | Ht 62.0 in | Wt 127.0 lb

## 2022-05-23 DIAGNOSIS — R0781 Pleurodynia: Secondary | ICD-10-CM | POA: Diagnosis not present

## 2022-05-23 MED ORDER — MELOXICAM 15 MG PO TABS: 15.0000 mg | ORAL_TABLET | Freq: Every day | ORAL | 0 refills | Status: DC

## 2022-05-23 NOTE — Progress Notes (Signed)
    Benito Mccreedy D.[place] [facility] Phone: [phone]   Assessment and Plan:     1. Rib pain on right side - Acute, uncomplicated, initial sports medicine visit - Most consistent with serratus anterior pulling and straining right anterior/lateral rib cage - No red flag symptoms on physical exam, so no imaging today - Start meloxicam 15 mg daily x2 weeks.  If still having pain after 2 weeks, complete 3rd-week of meloxicam. May use remaining meloxicam as needed once daily for pain control.  Do not to use additional NSAIDs while taking meloxicam.  May use Tylenol [phone] mg 2 to 3 times a day for breakthrough pain. - Avoid painful activities at work   Pertinent previous records reviewed include none   Follow Up: As needed if no improvement or worsening of symptoms.  Could obtain right-sided rib cage x-ray   Subjective:   I, [place], am serving as a Education administrator for Doctor Glennon Mac  Chief Complaint: right side rib pain   HPI:   05/23/22 Patient is a 61 year old female complaining of right sided rib pain. Patient state pain under breast, she was reaching back to grab her wallet and felt a painful pop but now its achy aleve yesterday now she has pain with all motions and taking in breaths , she thinks something might have "dislocated" no numbness or tingling,   Relevant Historical Information:  HTN  Additional pertinent review of systems negative.   Current Outpatient Medications:    amLODipine (NORVASC) 10 MG tablet, TAKE 1 TABLET BY MOUTH EVERY DAY, Disp: 30 tablet, Rfl: 0   meloxicam (MOBIC) 15 MG tablet, Take 1 tablet (15 mg total) by mouth daily., Disp: 30 tablet, Rfl: 0   Objective:     Vitals:   05/23/22 1507  BP: 110/68  Pulse: 76  SpO2: 100%  Weight: 127 lb (57.6 kg)  Height: 5\' 2"  (1.575 m)      Body mass index is 23.23 kg/m.    Physical Exam:    General: Well-appearing,  cooperative, sitting comfortably in no acute distress.  HEENT: Normocephalic, atraumatic.   Neck: No gross abnormality.  Cardiovascular: No pallor or cyanosis. Resp: Comfortable WOB.  No pain with deep inhalation or exhalation.  No pain with coughing.  Minimally tender over right-sided rib cage anterior axillary line ribs 6-7.  No flail chest.  Symmetrical inhalation and exhalation of rib cage Abdomen: Non distended.   Skin: Warm and dry; no focal rashes identified on limited exam.     Electronically signed by:  Benito Mccreedy D.[facility] 3:26 PM 05/23/22

## 2022-05-23 NOTE — Patient Instructions (Addendum)
Good to see you  - Start meloxicam 15 mg daily x2 weeks.  If still having pain after 2 weeks, complete 3rd-week of meloxicam. May use remaining meloxicam as needed once daily for pain control.  Do not to use additional NSAIDs while taking meloxicam.  May use Tylenol [phone] mg 2 to 3 times a day for breakthrough pain. Avoid painful activities at work  A needed follow up if no improvement 3-4 week follow up

## 2022-06-28 DIAGNOSIS — C44311 Basal cell carcinoma of skin of nose: Secondary | ICD-10-CM | POA: Diagnosis not present

## 2022-07-05 DIAGNOSIS — Z4802 Encounter for removal of sutures: Secondary | ICD-10-CM | POA: Diagnosis not present

## 2022-08-14 DIAGNOSIS — Z85828 Personal history of other malignant neoplasm of skin: Secondary | ICD-10-CM | POA: Diagnosis not present

## 2022-08-14 DIAGNOSIS — L578 Other skin changes due to chronic exposure to nonionizing radiation: Secondary | ICD-10-CM | POA: Diagnosis not present

## 2022-11-15 ENCOUNTER — Other Ambulatory Visit: Payer: Self-pay | Admitting: Family Medicine

## 2022-12-11 ENCOUNTER — Telehealth: Payer: Self-pay | Admitting: Family Medicine

## 2022-12-11 NOTE — Telephone Encounter (Signed)
Please advise 

## 2022-12-11 NOTE — Telephone Encounter (Signed)
Pt had a referral from last year lapse and would like a new one sent to a Psychology office. Please advise.

## 2022-12-20 ENCOUNTER — Other Ambulatory Visit: Payer: Self-pay | Admitting: *Deleted

## 2022-12-20 DIAGNOSIS — F419 Anxiety disorder, unspecified: Secondary | ICD-10-CM

## 2022-12-20 NOTE — Telephone Encounter (Signed)
Patient called for an update. Requests a referral as soon as possible.

## 2022-12-20 NOTE — Telephone Encounter (Signed)
Patient aware referral placed. 

## 2023-01-22 ENCOUNTER — Ambulatory Visit: Payer: BC Managed Care – PPO | Admitting: Licensed Clinical Social Worker

## 2023-01-29 ENCOUNTER — Other Ambulatory Visit: Payer: Self-pay | Admitting: Family Medicine

## 2023-01-29 DIAGNOSIS — Z1231 Encounter for screening mammogram for malignant neoplasm of breast: Secondary | ICD-10-CM

## 2023-01-31 ENCOUNTER — Encounter: Payer: Self-pay | Admitting: Licensed Clinical Social Worker

## 2023-01-31 ENCOUNTER — Ambulatory Visit (INDEPENDENT_AMBULATORY_CARE_PROVIDER_SITE_OTHER): Payer: BC Managed Care – PPO | Admitting: Licensed Clinical Social Worker

## 2023-01-31 DIAGNOSIS — F419 Anxiety disorder, unspecified: Secondary | ICD-10-CM | POA: Diagnosis not present

## 2023-01-31 NOTE — Progress Notes (Signed)
[facility] Counselor/Therapist Progress Note  Patient ID: Christina Hendricks, MRN: [id number]    Date: 01/31/23  Time Spent: 120  pm - 0220 pm : 60 Minutes  Treatment Type: Individual Therapy/Assessment/TX PLAN  Reported Symptoms: PT reports symptoms of anxiety related to aging and dying.  Mental Status Exam: Appearance:  Well Groomed     Behavior: Appropriate  Motor: Normal  Speech/Language:  Clear and Coherent  Affect: Flat  Mood: irritable  Thought process: normal  Thought content:   WNL  Sensory/Perceptual disturbances:   WNL  Orientation: oriented to person, place, time/date, situation, day of week, month of year, and year  Attention: Good  Concentration: Good  Memory: WNL  Fund of knowledge:  Good  Insight:   Good  Judgment:  Good  Impulse Control: Good   Risk Assessment: Danger to Self:  No Self-injurious Behavior: No Danger to Others: No Duty to Warn:no Physical Aggression / Violence:No  Access to Firearms a concern: No  Gang Involvement:No   Subjective:   Christina Hendricks participated in completion of assessment and development of treatment plan and goals and consented to treatment. Patient presented on time for her scheduled appointment and engaged in discussion. PT was dressed appropriately in casual attire and was neat and clean in her appearance.    Patient identified a constant fear of aging an dying and due to this identified an increase in symptoms of anxiety including not being able to control her worry and obsessive thoughts related to dying and aging as well as a fear that something bad may happen. Patient completed the GAD-7 and scored a 5 indicating mild anxiety. Patient completed the PHQ-9 Depression Scale and scored a 0.  Interventions: Cognitive Behavioral Therapy  Diagnosis: Anxiety Disorder NOS   Presenting Problem Chief Complaint: PT reports an excessive and ongoing fear of aging and death. PT reports that this has been ongoing  since she was a small child. PT reports that in the past 2 years it has became a daily stressor and feels it has began to affect her daily functioning.  What are the main stressors in your life right now, how long? Depression  3, Anxiety   3, Racing Thoughts   3, Excessive Worrying   3, and Obsessive Thoughts   3   Previous mental health services Have you ever been treated for a mental health problem, when, where, by whom? No  NA   Are you currently seeing a therapist or counselor, counselor's name? No NA  Have you ever had a mental health hospitalization, how many times, length of stay? No NA  Have you ever been treated with medication, name, reason, response? Yes PT reports being treated with anxiety medication many years ago but unable to recall the medication name and provider.  Have you ever had suicidal thoughts or attempted suicide, when, how? No NA  Risk factors for Suicide Demographic factors:  Living alone Current mental status: No plan to harm self or others Loss factors: Decrease in vocational status Historical factors: NA Risk Reduction factors: Sense of responsibility to family Clinical factors:  Severe Anxiety and/or Agitation Cognitive features that contribute to risk: NA    SUICIDE RISK:  Minimal: No identifiable suicidal ideation.  Patients presenting with no risk factors but with morbid ruminations; may be classified as minimal risk based on the severity of the depressive symptoms  Medical history Medical treatment and/or problems, explain: No PT denied medical issues but has agreed to make an appointment with PCP  to get a wellness check up. Do you have any issues with chronic pain?  No NA Name of primary care physician/last physical exam: Dr. Jacquiline Hendricks, PT reports that she will make an appointment for a wellness visit.   Allergies: Yes Medication, reactions? PT reports an allergy to arrythamicin   Current medications: PT reports taking medications for high  BP Prescribed by: DR> Christina Hendricks Is there any history of mental health problems or substance abuse in your family, whom? No NA Has anyone in your family been hospitalized, who, where, length of stay? No NA  Social/family history Have you been married, how many times?  1  Do you have children?  2  How many pregnancies have you had?  2  Who lives in your current household? PT reports living alone  Military history: No NA  Religious/spiritual involvement: NA What religion/faith base are you? Quaker  Family of origin (childhood history) PT reports that her family consisted of her Mother, Father and 2 siblings and herself.  Where were you born? [place] Where did you grow up? PT reports growing up all over the [place] primarily in [place] and [place]. How many different homes have you lived? PT reports living in over 12 homes in her lifetime.  Describe the atmosphere of the household where you grew up: PT reports her home as being "Fantastic" and denied Abuse/Trauma in the home. Do you have siblings, step/half siblings, list names, relation, sex, age? Yes BIO- Brother-Christina Hendricks age 16, Sister-Christina Hendricks age 49  Are your parents separated/divorced, when and why? No Parents married fo rover 70 years  Are your parents alive? Yes Mother is alive and Father is deceased.  Social supports (personal and professional): Sister, daughter and friends.  Education How many grades have you completed? post college graduate work or degree Did you have any problems in school, what type? No NA Medications prescribed for these problems? No NA  Employment (financial issues) PT denied financial stressors.   Legal history: PT denied past or current legal issues.   Trauma/Abuse history:PT denied Have you ever been exposed to any form of abuse, what type? No NA  Have you ever been exposed to something traumatic, describe? 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  Substance  use Do you use Caffeine? Yes Type, frequency? 1 cup daily  Do you use Nicotine? Yes Type, frequency, ppd? 10 cigarettes daily   Do you use Alcohol? Yes Type, frequency? 3 glasses of liquor daily  How old were you went you first tasted alcohol? PT reports being a teenager when she first tasted alcohol. Was this accepted by your family? Yes  When was your last drink, type, how much? Last night a glass of liquor  Have you ever used illicit drugs or taken more than prescribed, type, frequency, date of last usage? Yes PT reports experimenting with Marijuana and cocaine in the 70's and 80's   Diagnosis AXIS I Anxiety Disorder NOS  AXIS II No diagnosis  AXIS III @[facility] @  AXIS IV problems with primary support group  AXIS V 51-60 moderate symptoms   Treatment Plan:  PT reports stress and anxiety related to her constant fear and dwelling on the idea of aging and dying. PT reports that this negatively impacts her daily functioning     Client Abilities/Strengths: PT is educated and a retired Runner, broadcasting/film/video. PT is well traveled and insightful.  Support System: Friends, sister and daughter   Client Treatment Preferences Cognitive Behavioral  Therapy  Client Statement of Needs       "I want to improve my relationship with my children and to stop worrying so much about aging and dying."  Treatment Level Moderate  Symptoms: Excessive worry and fear  Goals:  "I want to improve my relationship with my children and to stop worrying so much about aging and dying."   Target Date: 08/03/2023 Frequency: weekly  Progress: 0 Modality: individual    Therapist will provide referrals for additional resources as appropriate.  Therapist will provide psycho-education regarding anxiety and depression related to her excessive fear and worry of death and dying, and corresponding treatment approaches and interventions. Licensed Clinical Social Worker, Phyllis Ginger, LCSW will support the patient's ability to  achieve the goals identified. will employ CBT, BA, Problem-solving, Solution Focused, Mindfulness,  coping skills, & other evidenced-based practices will be used to promote progress towards healthy functioning to help manage and decrease symptoms associated with her diagnosis.   Reduce overall level, frequency, and intensity of the feelings of anxiety and panic evidenced by decreased from 6 to 7 days/week to 0 to 2 days/week per client report for at least 3 consecutive months. Verbally express understanding of the relationship between feelings of worry and  anxiety and their impact on thinking patterns and behaviors. Verbalize an understanding of the role that distorted thinking plays in creating fears, excessive worry, and ruminations.            Christina Hendricks participated in the creation of the treatment plan.  Phyllis Ginger MSW, LCSW  __________________________________Emily Sherron Hendricks MSW, LCSW DATE: 01/31/2023            Plan: Christina Hendricks is to use CBT, mindfulness and coping skills to help manage decrease symptoms associated with their diagnosis.   Long-term goal:   Christina Hendricks will reduce overall level, frequency, and intensity of the feelings of anxiety and panic evidenced by decreased worry and obsessive thoughts, negative self talk, and helpless feelings from 6 to 7 days/week to 0 to 2 days/week per client report for at least 3 consecutive months.  Short-term goal:  Christina Hendricks will verbally express understanding of the relationship between feelings of worry, fear and anxiety and their impact on thinking patterns and behaviors. Verbalize an understanding of the role that distorted thinking plays in creating fears, excessive worry, and ruminations.  Phyllis Ginger MSW, LCSW DATE 01/31/2023

## 2023-02-14 ENCOUNTER — Ambulatory Visit (INDEPENDENT_AMBULATORY_CARE_PROVIDER_SITE_OTHER): Payer: BC Managed Care – PPO | Admitting: Licensed Clinical Social Worker

## 2023-02-14 ENCOUNTER — Encounter: Payer: Self-pay | Admitting: Licensed Clinical Social Worker

## 2023-02-14 DIAGNOSIS — F419 Anxiety disorder, unspecified: Secondary | ICD-10-CM

## 2023-02-14 NOTE — Progress Notes (Signed)
[facility] Counselor/Therapist Progress Note  Patient ID: Christina Hendricks, MRN: [id number]    Date: 02/14/23  Time Spent: 0100  pm - 0200 pm : 60 Minutes  Treatment Type: Individual Therapy.  Reported Symptoms: Symptoms of anxiety related to fear of aging and dying  Mental Status Exam: Appearance:  Casual     Behavior: Appropriate  Motor: Normal  Speech/Language:  Clear and Coherent  Affect: Appropriate  Mood: normal  Thought process: normal  Thought content:   WNL  Sensory/Perceptual disturbances:   WNL  Orientation: oriented to person, place, time/date, situation, day of week, month of year, and year  Attention: Good  Concentration: Good  Memory: WNL  Fund of knowledge:  Good  Insight:   Good  Judgment:  Good  Impulse Control: Good   Risk Assessment: Danger to Self:  No Self-injurious Behavior: No Danger to Others: No Duty to Warn:no Physical Aggression / Violence:No  Access to Firearms a concern: No  Gang Involvement:No   Subjective:   Christina Hendricks participated from office with Clinician present and consented to treatment. Therapist participated from.   Patient presented on time for her scheduled session. Patient was polite and cooperative. Patient identified that she feels consumed with thoughts of aging and dying. Patient identified that her biggest fear is dying but also not being healthy enough to do the things that she enjoys. 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 Patient is insightful and motivated and desires to make healthy improvements in her overall well being.   Interventions: Cognitive Behavioral Therapy  Diagnosis: Anxiety   Plan: Christina Hendricks is to use CBT, mindfulness and coping skills to help manage decrease symptoms associated with their diagnosis.   Long-term goal:   Christina Hendricks will reduce overall level,  frequency, and intensity of the feelings of depression, anxiety and panic evidenced by       decreased irritability, negative self talk, and helpless feelings from 6 to 7 days/week to 0 to 2 days/week per client report for at least 3 consecutive months. Treatment plan to be reviewed by 01/31/2024.  Short-term goal:   Christina Hendricks will verbally express understanding of the relationship between feelings of depression, anxiety and their impact on thinking patterns and behaviors. Verbalize an understanding of the role that distorted thinking plays in creating fears, excessive worry, and ruminations.  Phyllis Ginger MSW, LCSW DATE: 02/14/2023

## 2023-02-28 ENCOUNTER — Ambulatory Visit: Payer: BC Managed Care – PPO | Admitting: Licensed Clinical Social Worker

## 2023-03-07 ENCOUNTER — Encounter (INDEPENDENT_AMBULATORY_CARE_PROVIDER_SITE_OTHER): Payer: Self-pay

## 2023-03-12 ENCOUNTER — Ambulatory Visit
Admission: RE | Admit: 2023-03-12 | Discharge: 2023-03-12 | Disposition: A | Discharge status: Home / Self Care | Payer: BC Managed Care – PPO | Source: Ambulatory Visit | Attending: Family Medicine | Admitting: Family Medicine

## 2023-03-12 ENCOUNTER — Ambulatory Visit: Payer: BC Managed Care – PPO

## 2023-03-12 DIAGNOSIS — Z1231 Encounter for screening mammogram for malignant neoplasm of breast: Secondary | ICD-10-CM | POA: Diagnosis not present

## 2023-04-02 ENCOUNTER — Ambulatory Visit (INDEPENDENT_AMBULATORY_CARE_PROVIDER_SITE_OTHER): Payer: BC Managed Care – PPO | Admitting: Family Medicine

## 2023-04-02 ENCOUNTER — Encounter: Payer: Self-pay | Admitting: Family Medicine

## 2023-04-02 ENCOUNTER — Other Ambulatory Visit (HOSPITAL_COMMUNITY)
Admission: RE | Admit: 2023-04-02 | Discharge: 2023-04-02 | Disposition: A | Discharge status: Home / Self Care | Payer: BC Managed Care – PPO | Source: Ambulatory Visit | Attending: Family Medicine | Admitting: Family Medicine

## 2023-04-02 VITALS — BP 120/68 | HR 72 | Temp 97.5°F | Ht 62.0 in | Wt 125.8 lb

## 2023-04-02 DIAGNOSIS — L578 Other skin changes due to chronic exposure to nonionizing radiation: Secondary | ICD-10-CM

## 2023-04-02 DIAGNOSIS — R739 Hyperglycemia, unspecified: Secondary | ICD-10-CM | POA: Diagnosis not present

## 2023-04-02 DIAGNOSIS — F17200 Nicotine dependence, unspecified, uncomplicated: Secondary | ICD-10-CM | POA: Diagnosis not present

## 2023-04-02 DIAGNOSIS — F17210 Nicotine dependence, cigarettes, uncomplicated: Secondary | ICD-10-CM

## 2023-04-02 DIAGNOSIS — Z0001 Encounter for general adult medical examination with abnormal findings: Secondary | ICD-10-CM

## 2023-04-02 DIAGNOSIS — I10 Essential (primary) hypertension: Secondary | ICD-10-CM

## 2023-04-02 DIAGNOSIS — Z0000 Encounter for general adult medical examination without abnormal findings: Secondary | ICD-10-CM

## 2023-04-02 DIAGNOSIS — F419 Anxiety disorder, unspecified: Secondary | ICD-10-CM | POA: Diagnosis not present

## 2023-04-02 DIAGNOSIS — E785 Hyperlipidemia, unspecified: Secondary | ICD-10-CM

## 2023-04-02 DIAGNOSIS — Z23 Encounter for immunization: Secondary | ICD-10-CM | POA: Diagnosis not present

## 2023-04-02 DIAGNOSIS — Z124 Encounter for screening for malignant neoplasm of cervix: Secondary | ICD-10-CM

## 2023-04-02 LAB — LIPID PANEL
Cholesterol: 219 mg/dL — ABNORMAL HIGH (ref 0–200)
HDL: 86.9 mg/dL (ref >39.00)
LDL Cholesterol: 105 mg/dL — ABNORMAL HIGH (ref 0–99)
NonHDL: 132.16
Total CHOL/HDL Ratio: 3
Triglycerides: 137 mg/dL (ref 0.0–149.0)
VLDL: 27.4 mg/dL (ref 0.0–40.0)

## 2023-04-02 LAB — COMPREHENSIVE METABOLIC PANEL
ALT: 23 U/L (ref 0–35)
AST: 25 U/L (ref 0–37)
Albumin: 4.4 g/dL (ref 3.5–5.2)
Alkaline Phosphatase: 79 U/L (ref 39–117)
BUN: 14 mg/dL (ref 6–23)
CO2: 28 meq/L (ref 19–32)
Calcium: 9.4 mg/dL (ref 8.4–10.5)
Chloride: 104 meq/L (ref 96–112)
Creatinine, Ser: 0.63 mg/dL (ref 0.40–1.20)
GFR: 96.3 mL/min (ref >60.00)
Glucose, Bld: 91 mg/dL (ref 70–99)
Potassium: 3.7 meq/L (ref 3.5–5.1)
Sodium: 139 meq/L (ref 135–145)
Total Bilirubin: 0.5 mg/dL (ref 0.2–1.2)
Total Protein: 7.1 g/dL (ref 6.0–8.3)

## 2023-04-02 LAB — CBC
HCT: 39.6 % (ref 36.0–46.0)
Hemoglobin: 13.2 g/dL (ref 12.0–15.0)
MCHC: 33.4 g/dL (ref 30.0–36.0)
MCV: 97 fl (ref 78.0–100.0)
Platelets: 301 10*3/uL (ref 150.0–400.0)
RBC: 4.08 Mil/uL (ref 3.87–5.11)
RDW: 13.9 % (ref 11.5–15.5)
WBC: 7.5 10*3/uL (ref 4.0–10.5)

## 2023-04-02 LAB — TSH: TSH: 0.6 u[IU]/mL (ref 0.35–5.50)

## 2023-04-02 LAB — HEMOGLOBIN A1C: Hgb A1c MFr Bld: 5.8 % (ref 4.6–6.5)

## 2023-04-02 MED ORDER — CLONAZEPAM 0.5 MG PO TABS: 0.5000 mg | ORAL_TABLET | Freq: Two times a day (BID) | ORAL | 1 refills | Status: DC | PRN

## 2023-04-02 NOTE — Patient Instructions (Addendum)
It was very nice to see you today!  We will check blood work today.  Please continue to work on diet and exercise.  I will refill your clonazepam today.  I will also refer you to see a psychologist.  We completed your Pap smear today.  Will see back in year for next physical.  Come back sooner if needed.  Return in about 1 year (around 04/01/2024) for Annual Physical.   Take care, Dr Jimmey Ralph  PLEASE NOTE:  If you had any lab tests, please let us know if you have not heard back within a few days. You may see your results on mychart before we have a chance to review them but we will give you a call once they are reviewed by [place].   If we ordered any referrals today, please let us know if you have not heard from their office within the next week.   If you had any urgent prescriptions sent in today, please check with the pharmacy within an hour of our visit to make sure the prescription was transmitted appropriately.   Please try these tips to maintain a healthy lifestyle:  Eat at least 3 REAL meals and 1-2 snacks per day.  Aim for no more than 5 hours between eating.  If you eat breakfast, please do so within one hour of getting up.   Each meal should contain half fruits/vegetables, one quarter protein, and one quarter carbs (no bigger than a computer mouse)  Cut down on sweet beverages. This includes juice, soda, and sweet tea.   Drink at least 1 glass of water with each meal and aim for at least 8 glasses per day  Exercise at least 150 minutes every week.    Preventive Care 48-63 Years Old, Female Preventive care refers to lifestyle choices and visits with your health care provider that can promote health and wellness. Preventive care visits are also called wellness exams. What can I expect for my preventive care visit? Counseling Your health care provider may ask you questions about your: Medical history, including: Past medical problems. Family medical history. Pregnancy  history. Current health, including: Menstrual cycle. Method of birth control. Emotional well-being. Home life and relationship well-being. Sexual activity and sexual health. Lifestyle, including: Alcohol, nicotine or tobacco, and drug use. Access to firearms. Diet, exercise, and sleep habits. Work and work Astronomer. Sunscreen use. Safety issues such as seatbelt and bike helmet use. Physical exam Your health care provider will check your: Height and weight. These may be used to calculate your BMI (body mass index). BMI is a measurement that tells if you are at a healthy weight. Waist circumference. This measures the distance around your waistline. This measurement also tells if you are at a healthy weight and may help predict your risk of certain diseases, such as type 2 diabetes and high blood pressure. Heart rate and blood pressure. Body temperature. Skin for abnormal spots. What immunizations do I need?  Vaccines are usually given at various ages, according to a schedule. Your health care provider will recommend vaccines for you based on your age, medical history, and lifestyle or other factors, such as travel or where you work. What tests do I need? Screening Your health care provider may recommend screening tests for certain conditions. This may include: Lipid and cholesterol levels. Diabetes screening. This is done by checking your blood sugar (glucose) after you have not eaten for a while (fasting). Pelvic exam and Pap test. Hepatitis B test. Hepatitis C  test. HIV (human immunodeficiency virus) test. STI (sexually transmitted infection) testing, if you are at risk. Lung cancer screening. Colorectal cancer screening. Mammogram. Talk with your health care provider about when you should start having regular mammograms. This may depend on whether you have a family history of breast cancer. BRCA-related cancer screening. This may be done if you have a family history of  breast, ovarian, tubal, or peritoneal cancers. Bone density scan. This is done to screen for osteoporosis. Talk with your health care provider about your test results, treatment options, and if necessary, the need for more tests. Follow these instructions at home: Eating and drinking  Eat a diet that includes fresh fruits and vegetables, whole grains, lean protein, and low-fat dairy products. Take vitamin and mineral supplements as recommended by your health care provider. Do not drink alcohol if: Your health care provider tells you not to drink. You are pregnant, may be pregnant, or are planning to become pregnant. If you drink alcohol: Limit how much you have to 0-1 drink a day. Know how much alcohol is in your drink. In the [place] one drink equals one 12 oz bottle of beer (355 mL), one 5 oz glass of wine (148 mL), or one 1 oz glass of hard liquor (44 mL). Lifestyle Brush your teeth every morning and night with fluoride toothpaste. Floss one time each day. Exercise for at least 30 minutes 5 or more days each week. Do not use any products that contain nicotine or tobacco. These products include cigarettes, chewing tobacco, and vaping devices, such as e-cigarettes. If you need help quitting, ask your health care provider. Do not use drugs. If you are sexually active, practice safe sex. Use a condom or other form of protection to prevent STIs. If you do not wish to become pregnant, use a form of birth control. If you plan to become pregnant, see your health care provider for a prepregnancy visit. Take aspirin only as told by your health care provider. Make sure that you understand how much to take and what form to take. Work with your health care provider to find out whether it is safe and beneficial for you to take aspirin daily. Find healthy ways to manage stress, such as: Meditation, yoga, or listening to music. Journaling. Talking to a trusted person. Spending time with friends and  family. Minimize exposure to UV radiation to reduce your risk of skin cancer. Safety Always wear your seat belt while driving or riding in a vehicle. Do not drive: If you have been drinking alcohol. Do not ride with someone who has been drinking. When you are tired or distracted. While texting. If you have been using any mind-altering substances or drugs. Wear a helmet and other protective equipment during sports activities. If you have firearms in your house, make sure you follow all gun safety procedures. Seek help if you have been physically or sexually abused. What's next? Visit your health care provider once a year for an annual wellness visit. Ask your health care provider how often you should have your eyes and teeth checked. Stay up to date on all vaccines. This information is not intended to replace advice given to you by your health care provider. Make sure you discuss any questions you have with your health care provider. Document Revised: 01/04/2021 Document Reviewed: 01/04/2021 Elsevier Patient Education  2024 [employer].

## 2023-04-02 NOTE — Assessment & Plan Note (Signed)
Continue management per dermatology.  

## 2023-04-02 NOTE — Assessment & Plan Note (Signed)
Symptom are not controlled.  Had lengthy discussion with patient today regarding her anxiety.  She still has a a lot of existential dread and fear over death and the dying process.  She did see an LCSW last year however did not find this was particularly effective.  She would likely benefit from CBT.  We will refer to psychology for this.  Will also refill her clonazepam today.  This was last refilled a couple of years ago.  Discussed that she should only use this very sparingly as needed for episodes of high anxiety.  She has done well with this previously without any significant side effects.  She is aware this is not going to be a long-term or persistent medication.  Will be referring her to therapy as above.  If she does need extra medication to help her baseline anxiety would consider trial of SSRI or BuSpar.

## 2023-04-02 NOTE — Assessment & Plan Note (Signed)
Check lipids. Discussed lifestyle modifications.  

## 2023-04-02 NOTE — Assessment & Plan Note (Addendum)
Patient was asked about her tobacco use today and was strongly advised to quit. Patient is currently contemplative. We reviewed treatment options to assist her quit smoking including NRT, Chantix, and Bupropion.  She is working on weaning down.  Follow up at next office visit.   Will place referral for lung cancer screening.  Total time spent counseling approximately 3 minutes.

## 2023-04-02 NOTE — Assessment & Plan Note (Signed)
Blood pressure at goal today on amlodipine 10 mg daily. 

## 2023-04-02 NOTE — Progress Notes (Signed)
Chief Complaint:  Christina Hendricks is a 62 y.o. female who presents today for her annual comprehensive physical exam.    Assessment/Plan:  Chronic Problems Addressed Today: Essential hypertension Blood pressure at goal today on amlodipine 10 mg daily.  Nicotine dependence with current use Patient was asked about her tobacco use today and was strongly advised to quit. Patient is currently contemplative. We reviewed treatment options to assist her quit smoking including NRT, Chantix, and Bupropion.  She is working on weaning down.  Follow up at next office visit.   Will place referral for lung cancer screening.  Total time spent counseling approximately 3 minutes.    Dyslipidemia Check lipids.  Discussed lifestyle modifications.  Anxiety Symptom are not controlled.  Had lengthy discussion with patient today regarding her anxiety.  She still has a a lot of existential dread and fear over death and the dying process.  She did see an LCSW last year however did not find this was particularly effective.  She would likely benefit from CBT.  We will refer to psychology for this.  Will also refill her clonazepam today.  This was last refilled a couple of years ago.  Discussed that she should only use this very sparingly as needed for episodes of high anxiety.  She has done well with this previously without any significant side effects.  She is aware this is not going to be a long-term or persistent medication.  Will be referring her to therapy as above.  If she does need extra medication to help her baseline anxiety would consider trial of SSRI or BuSpar.  Sun-damaged skin Continue management per dermatology.  Preventative Healthcare: Check labs. Pap performed today.  Up-to-date on mammogram.  Flu shot given today.   Patient Counseling(The following topics were reviewed and/or handout was given):  -Nutrition: Stressed importance of moderation in sodium/caffeine intake, saturated fat and  cholesterol, caloric balance, sufficient intake of fresh fruits, vegetables, and fiber.  -Stressed the importance of regular exercise.   -Substance Abuse: Discussed cessation/primary prevention of tobacco, alcohol, or other drug use; driving or other dangerous activities under the influence; availability of treatment for abuse.   -Injury prevention: Discussed safety belts, safety helmets, smoke detector, smoking near bedding or upholstery.   -Sexuality: Discussed sexually transmitted diseases, partner selection, use of condoms, avoidance of unintended pregnancy and contraceptive alternatives.   -Dental health: Discussed importance of regular tooth brushing, flossing, and dental visits.  -Health maintenance and immunizations reviewed. Please refer to Health maintenance section.  Return to care in 1 year for next preventative visit.     Subjective:  HPI:   See Assessment / plan for status of chronic conditions.  She has no acute concerns today.  Does admit that anxiety is still high and uncontrolled.  We discussed this with her over the last couple of years.  She still has a lot of existential dread.  We did refer her to see a therapist however she is right for like this is effective and did not see them back after a couple of sessions.  She has been on clonazepam in the past and request refill today.  She will be traveling to [place] soon and traveling can spike her anxiety.  Her last refill was a couple of years ago.  Lifestyle Diet: Balanced.  Exercise: Very active with work.      04/02/2023    9:12 AM  Depression screen PHQ 2/9  Decreased Interest 0  Down, Depressed, Hopeless 0  PHQ -  2 Score 0    Health Maintenance Due  Topic Date Due   DTaP/Tdap/Td (1 - Tdap) Never done     ROS: Per HPI, otherwise a complete review of systems was negative.   PMH:  The following were reviewed and entered/updated in epic: Past Medical History:  Diagnosis Date   Hypertension    Patient  Active Problem List   Diagnosis Date Noted   Sun-damaged skin 03/13/2022   Anxiety 11/18/2020   Dyslipidemia 05/19/2020   Essential hypertension 05/16/2020   Nicotine dependence with current use 05/16/2020   History reviewed. No pertinent surgical history.  Family History  Problem Relation Age of Onset   Breast cancer Neg Hx     Medications- reviewed and updated Current Outpatient Medications  Medication Sig Dispense Refill   amLODipine (NORVASC) 10 MG tablet TAKE 1 TABLET BY MOUTH EVERY DAY 90 tablet 1   clonazePAM (KLONOPIN) 0.5 MG tablet Take 1 tablet (0.5 mg total) by mouth 2 (two) times daily as needed for anxiety. 20 tablet 1   No current facility-administered medications for this visit.    Allergies-reviewed and updated Allergies  Allergen Reactions   Erythromycin     Social History   Socioeconomic History   Marital status: Divorced    Spouse name: Not on file   Number of children: 2   Years of education: Not on file   Highest education level: Not on file  Occupational History   Not on file  Tobacco Use   Smoking status: Every Day    Current packs/day: 0.50    Average packs/day: 0.5 packs/day for 40.0 years (20.0 ttl pk-yrs)    Types: Cigarettes    Start date: 04/02/1983   Smokeless tobacco: Not on file  Substance and Sexual Activity   Alcohol use: Yes    Alcohol/week: 7.0 standard drinks of alcohol    Types: 7 Glasses of wine per week   Drug use: Not Currently    Types: Marijuana   Sexual activity: Not on file  Other Topics Concern   Not on file  Social History Narrative   2 children   Retired Runner, broadcasting/film/video.  [employer]   Social Determinants of Corporate investment banker Strain: Not on file  Food Insecurity: Not on file  Transportation Needs: Not on file  Physical Activity: Not on file  Stress: Not on file  Social Connections: Not on file        Objective:  Physical Exam: BP 120/68   Pulse 72   Temp (!) 97.5 F (36.4 C) (Temporal)   Ht  5\' 2"  (1.575 m)   Wt 125 lb 12.8 oz (57.1 kg)   SpO2 100%   BMI 23.01 kg/m   Body mass index is 23.01 kg/m. Wt Readings from Last 3 Encounters:  04/02/23 125 lb 12.8 oz (57.1 kg)  05/23/22 127 lb (57.6 kg)  03/13/22 127 lb 12.8 oz (58 kg)   Gen: NAD, resting comfortably HEENT: TMs normal bilaterally. OP clear. No thyromegaly noted.  CV: RRR with no murmurs appreciated Pulm: NWOB, CTAB with no crackles, wheezes, or rhonchi GI: Normal bowel sounds present. Soft, Nontender, Nondistended. MSK: no edema, cyanosis, or clubbing noted Skin: warm, dry Neuro: CN2-12 grossly intact. Strength 5/5 in upper and lower extremities. Reflexes symmetric and intact bilaterally.  Psych: Normal affect and thought content     Jeanann Balinski M. Jimmey Ralph, MD 04/02/2023 10:05 AM

## 2023-04-05 LAB — CYTOLOGY - PAP
Comment: NEGATIVE
Diagnosis: NEGATIVE
Diagnosis: REACTIVE
High risk HPV: NEGATIVE

## 2023-04-08 NOTE — Progress Notes (Signed)
Cholesterol and blood sugar are very mildly elevated.  Do not need to start meds but she should work on diet and exercise and we can recheck both these in a year or so.  The rest of her blood work is all normal and we can recheck everything in a year.  Her Pap was normal.  We can repeat this in 5 years.

## 2023-05-16 DIAGNOSIS — L738 Other specified follicular disorders: Secondary | ICD-10-CM | POA: Diagnosis not present

## 2023-05-16 DIAGNOSIS — D2261 Melanocytic nevi of right upper limb, including shoulder: Secondary | ICD-10-CM | POA: Diagnosis not present

## 2023-05-16 DIAGNOSIS — Z85828 Personal history of other malignant neoplasm of skin: Secondary | ICD-10-CM | POA: Diagnosis not present

## 2023-05-16 DIAGNOSIS — C44712 Basal cell carcinoma of skin of right lower limb, including hip: Secondary | ICD-10-CM | POA: Diagnosis not present

## 2023-05-16 DIAGNOSIS — L918 Other hypertrophic disorders of the skin: Secondary | ICD-10-CM | POA: Diagnosis not present

## 2023-05-16 DIAGNOSIS — L570 Actinic keratosis: Secondary | ICD-10-CM | POA: Diagnosis not present

## 2023-05-30 ENCOUNTER — Other Ambulatory Visit: Payer: Self-pay | Admitting: *Deleted

## 2023-05-30 DIAGNOSIS — Z87891 Personal history of nicotine dependence: Secondary | ICD-10-CM

## 2023-05-30 DIAGNOSIS — Z122 Encounter for screening for malignant neoplasm of respiratory organs: Secondary | ICD-10-CM

## 2023-05-30 DIAGNOSIS — F17210 Nicotine dependence, cigarettes, uncomplicated: Secondary | ICD-10-CM

## 2023-06-04 ENCOUNTER — Ambulatory Visit: Payer: BC Managed Care – PPO | Admitting: Acute Care

## 2023-06-04 ENCOUNTER — Encounter: Payer: Self-pay | Admitting: Acute Care

## 2023-06-04 DIAGNOSIS — F17210 Nicotine dependence, cigarettes, uncomplicated: Secondary | ICD-10-CM

## 2023-06-04 NOTE — Progress Notes (Signed)
Virtual Visit via Telephone Note  I connected with Terrace Arabia on 06/04/23 at  3:00 PM EST by telephone and verified that I am speaking with the correct person using two identifiers.  Location: Patient:  At home Provider: [address], [place], [place], [address]   I connected with Lucelle Flecker on 06/04/23 at  3:00 PM EST by telephone and verified that I am speaking with the correct person using two identifiers.  Location: Patient:  At home Provider:  [address], [place], [place], [address]    I discussed the limitations, risks, security and privacy concerns of performing an evaluation and management service by telephone and the availability of in person appointments. I also discussed with the patient that there may be a patient responsible charge related to this service. The patient expressed understanding and agreed to proceed.   Shared Decision Making Visit Lung Cancer Screening Program [phone])   Eligibility: Age 35 y.o. Pack Years Smoking History Calculation 34.5 pack year smoking history (# packs/per year x # years smoked) Recent History of coughing up blood  no Unexplained weight loss? no ( >Than 15 pounds within the last 6 months ) Prior History Lung / other cancer no (Diagnosis within the last 5 years already requiring surveillance chest CT Scans). Smoking Status Current Smoker Former Smokers: Years since quit:  NA  Quit Date:  NA  Visit Components: Discussion included one or more decision making aids. yes Discussion included risk/benefits of screening. yes Discussion included potential follow up diagnostic testing for abnormal scans. yes Discussion included meaning and risk of over diagnosis. yes Discussion included meaning and risk of False Positives. yes Discussion included meaning of total radiation exposure. yes  Counseling Included: Importance of adherence to annual lung cancer LDCT screening. yes Impact of comorbidities on ability to  participate in the program. yes Ability and willingness to under diagnostic treatment. yes  Smoking Cessation Counseling: Current Smokers:  Discussed importance of smoking cessation. yes Information about tobacco cessation classes and interventions provided to patient. yes Patient provided with "ticket" for LDCT Scan. yes Symptomatic Patient. no  Counseling NA Diagnosis Code: Tobacco Use Z72.0 Asymptomatic Patient yes  Counseling (Intermediate counseling: > three minutes counseling) V4259 Former Smokers:  Discussed the importance of maintaining cigarette abstinence. yes Diagnosis Code: Personal History of Nicotine Dependence. D63.875 Information about tobacco cessation classes and interventions provided to patient. Yes Patient provided with "ticket" for LDCT Scan. yes Written Order for Lung Cancer Screening with LDCT placed in Epic. Yes (CT Chest Lung Cancer Screening Low Dose W/O CM) [id number] Z12.2-Screening of respiratory organs Z87.891-Personal history of nicotine dependence   Bevelyn Ngo, NP 06/04/2023

## 2023-06-04 NOTE — Patient Instructions (Signed)

## 2023-06-13 ENCOUNTER — Ambulatory Visit
Admission: RE | Admit: 2023-06-13 | Discharge: 2023-06-13 | Disposition: A | Discharge status: Home / Self Care | Payer: BC Managed Care – PPO | Source: Ambulatory Visit | Attending: Acute Care | Admitting: Acute Care

## 2023-06-13 DIAGNOSIS — F17210 Nicotine dependence, cigarettes, uncomplicated: Secondary | ICD-10-CM | POA: Diagnosis not present

## 2023-06-13 DIAGNOSIS — Z87891 Personal history of nicotine dependence: Secondary | ICD-10-CM

## 2023-06-13 DIAGNOSIS — Z122 Encounter for screening for malignant neoplasm of respiratory organs: Secondary | ICD-10-CM

## 2023-06-16 ENCOUNTER — Other Ambulatory Visit: Payer: Self-pay | Admitting: Family Medicine

## 2023-06-18 ENCOUNTER — Ambulatory Visit (INDEPENDENT_AMBULATORY_CARE_PROVIDER_SITE_OTHER): Payer: BC Managed Care – PPO | Admitting: Licensed Clinical Social Worker

## 2023-06-18 DIAGNOSIS — F419 Anxiety disorder, unspecified: Secondary | ICD-10-CM | POA: Diagnosis not present

## 2023-06-18 NOTE — Progress Notes (Addendum)
[facility] Counselor Initial Adult Exam  Name: Christina Hendricks Date: 06/18/2023 MRN: [id number] DOB: 26-Jan-1961 PCP: Ardith Dark, MD  Time Spent: 9:00  am - 9:46 am : 46 Minutes  Guardian/Payee:  Self/Adult    Paperwork requested: No   Reason for Visit /Presenting Problem: Anxious thoughts about aging   Mental Status Exam: Appearance:   Well Groomed     Behavior:  Appropriate  Motor:  Normal  Speech/Language:   Clear and Coherent  Affect:  Appropriate  Mood:  anxious  Thought process:  normal  Thought content:    WNL  Sensory/Perceptual disturbances:    WNL  Orientation:  oriented to person, place, and time/date  Attention:  Good  Concentration:  Good  Memory:  WNL  Fund of knowledge:   Good  Insight:    Good  Judgment:   Good  Impulse Control:  Good   Reported Symptoms:  Early wake to follow routine and schedule, always on the go, work often and travel as ways to distract from grief and loss of father two years ago, people pleasing because of a desire to be liked  Risk Assessment: Danger to Self:  No Self-injurious Behavior: No Danger to Others: No Duty to [employer]   Physical Aggression / Violence:No  Access to Firearms a concern: No  Gang Involvement:No  Patient / guardian was educated about steps to take if suicide or homicide risk level increases between visits: n/a While future psychiatric events cannot be accurately predicted, the patient does not currently require acute inpatient psychiatric care and does not currently meet [facility] involuntary commitment criteria.  Substance Abuse History: Current substance abuse: No     Caffeine: Tobacco:Daily Usage Alcohol Nightly wine  Substance use:  Past Psychiatric History:   No previous psychological problems have been observed Outpatient Providers: N/A History of Psych Hospitalization: No  Psychological Testing:  N/A  Abuse History:  Victim of: No.  N/A   Report needed: No. Victim  of Neglect:No. Perpetrator of  N/A   Witness / Exposure to Domestic Violence: No   Protective Services Involvement: No  Witness to [employer] Violence:  No   Family History:  Family History  Problem Relation Age of Onset   Breast cancer Neg Hx     Living situation: the patient lives alone  Sexual Orientation: Straight  Relationship Status: divorced  Name of spouse / other:N/A If a parent, number of children / ages: Boy 76 Girl 30  Support Systems: lives alone  Surveyor, quantity Stress:  No   Income/Employment/Disability: Employment  Financial planner: No   Educational History: Education: college graduate  Religion/Sprituality/World View: Media planner  Any cultural differences that may affect / interfere with treatment:  not applicable   Recreation/Hobbies: Traveling, cooking, spending time with dog, outside with nature  Stressors: Health problems   Loss of Father 2 years ago  Strengths: Supportive Relationships, Family, Friends, and Journalist, newspaper  Barriers: None  Legal History: Pending legal issue / charges: The patient has no significant history of legal issues. History of legal issue / charges:  N/A  Medical History/Surgical History: not reviewed Past Medical History:  Diagnosis Date   Hypertension     No past surgical history on file.  Medications: Current Outpatient Medications  Medication Sig Dispense Refill   amLODipine (NORVASC) 10 MG tablet TAKE 1 TABLET BY MOUTH EVERY DAY 90 tablet 1   clonazePAM (KLONOPIN) 0.5 MG tablet Take 1 tablet (0.5 mg total) by mouth 2 (two) times daily as  needed for anxiety. 20 tablet 1   No current facility-administered medications for this visit.    Allergies  Allergen Reactions   Erythromycin     Diagnoses:  Anxiety  Psychiatric Treatment: No , N/A  Plan of Care: Outpatient Therapy in person preferred  Narrative:  Terrace Arabia participated from office with therapist and consented to treatment. We reviewed the  limits of confidentiality prior to the start of the evaluation. Terrace Arabia expressed understanding and agreement to proceed. Ref for anxiety from Jacquiline Doe, MD.  Patient reports working often to stay busy to fel productive and purposeful.  Father passed away two years ago and was the rock of support, dealing with the grief of his loss and how to manage day to day life.  Thoughts about aging range from being grateful but not happy about the changes of aging that are visual and real.  People pleasing tendencies in an effort to be liked.  Travel is a big part of life for patient as it brings joy to have new experiences.  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    A follow-up was scheduled to create a treatment plan and begin treatment. Therapist answered  and all questions during the evaluation and contact information was provided. Patient was invited to obtain a journal to start our work together.  3 daily gratitude writing daily until next session, next week.      Anselmo Pickler, [facility]

## 2023-07-02 ENCOUNTER — Telehealth: Payer: Self-pay | Admitting: Family Medicine

## 2023-07-02 NOTE — Telephone Encounter (Signed)
Pt would like a call back with imaging results 

## 2023-07-03 NOTE — Telephone Encounter (Signed)
Patient requesting results

## 2023-07-04 NOTE — Telephone Encounter (Signed)
Patient called for an update on results. States she is still sick and is starting to get worried. Please Advise.

## 2023-07-05 ENCOUNTER — Other Ambulatory Visit: Payer: Self-pay | Admitting: Acute Care

## 2023-07-05 DIAGNOSIS — Z122 Encounter for screening for malignant neoplasm of respiratory organs: Secondary | ICD-10-CM

## 2023-07-05 DIAGNOSIS — Z87891 Personal history of nicotine dependence: Secondary | ICD-10-CM

## 2023-07-05 DIAGNOSIS — F17210 Nicotine dependence, cigarettes, uncomplicated: Secondary | ICD-10-CM

## 2023-07-05 NOTE — Telephone Encounter (Signed)
Called and spoke to pt regarding her LDCT results. Informed her of the noted nodule 4.107mm in size and the confidence this is a benign nodule. Also spoke with patient regarding mild emphysema and the noted old rib fractures on her right side. Patient aware of the results and recs for 12 month scan. Order already placed. Pt verbalized understanding and denied any further questions or concerns at this time.

## 2023-07-05 NOTE — Telephone Encounter (Signed)
Please see msg from Kandice Robinsons and advise if anything further needed at this time

## 2024-01-02 ENCOUNTER — Other Ambulatory Visit: Payer: Self-pay | Admitting: Family Medicine

## 2024-02-06 ENCOUNTER — Other Ambulatory Visit: Payer: Self-pay | Admitting: Family Medicine

## 2024-02-06 MED ORDER — CLONAZEPAM 0.5 MG PO TABS: 0.5000 mg | ORAL_TABLET | Freq: Two times a day (BID) | ORAL | 1 refills | Status: DC | PRN

## 2024-02-06 NOTE — Telephone Encounter (Signed)
 04/02/23 20 tabs/1 RF

## 2024-02-06 NOTE — Telephone Encounter (Signed)
 Copied from CRM [id number]. Topic: Clinical - Medication Refill >> Feb 06, 2024  8:37 AM Burnard DEL wrote: Medication: clonazePAM  (KLONOPIN ) 0.5 MG tablet  Has the patient contacted their pharmacy? No (Agent: If no, request that the patient contact the pharmacy for the refill. If patient does not wish to contact the pharmacy document the reason why and proceed with request.) (Agent: If yes, when and what did the pharmacy advise?)  This is the patient's preferred pharmacy:  [id number] - [place], [place] - [address] AT [facility] [address]  [place] [address] Phone: [phone] Fax: [phone]  Is this the correct pharmacy for this prescription? Yes If no, delete pharmacy and type the correct one.   Has the prescription been filled recently? No  Is the patient out of the medication? Yes  Has the patient been seen for an appointment in the last year OR does the patient have an upcoming appointment? Yes  Can we respond through MyChart? Yes  Agent: Please be advised that Rx refills may take up to 3 business days. We ask that you follow-up with your pharmacy.

## 2024-03-16 ENCOUNTER — Ambulatory Visit: Payer: Self-pay

## 2024-03-16 NOTE — Telephone Encounter (Signed)
  FYI Only or Action Required?: FYI only for provider.  Patient was last seen in primary care on 04/02/2023 by Kennyth Worth HERO, MD.  Called Nurse Triage reporting Cyst.  Symptoms began several weeks ago.  Interventions attempted: Nothing.  Symptoms are: gradually worsening.  Triage Disposition: See Physician Within 24 Hours  Patient/caregiver understands and will follow disposition?: No, refuses disposition    Copied from CRM #[id number]. Topic: Clinical - Red Word Triage >> Mar 16, 2024  1:06 PM Rosina BIRCH wrote: Red Word that prompted transfer to Nurse Triage: cyst in left armpit and it is causing pain Reason for Disposition  [1] Spreading redness around the boil AND [2] no fever  Answer Assessment - Initial Assessment Questions 1. APPEARANCE of BOIL: What does the boil look like?      pink 2. LOCATION: Where is the boil located?      Left arm pit 3. NUMBER: How many boils are there?      1 4. SIZE: How big is the boil? (e.g., inches, cm; compare to size of a coin or other object)     Of a grape 5. ONSET: When did the boil start?     Been there for a long time 6. PAIN: Is there any pain? If Yes, ask: How bad is the pain?   (Scale 1-10; or mild, moderate, severe)     mild 7. FEVER: Do you have a fever? If Yes, ask: What is it, how was it measured, and when did it start?      no 8. SOURCE: Have you been around anyone with boils or other Staph infections? Have you ever had boils before?     no 9. OTHER SYMPTOMS: Do you have any other symptoms? (e.g., shaking chills, weakness, rash elsewhere on body)     no 10. PREGNANCY: Is there any chance you are pregnant? When was your last menstrual period?       na  Protocols used: Boil (Skin Abscess)-A-AH

## 2024-03-16 NOTE — Telephone Encounter (Signed)
 Noted

## 2024-03-17 ENCOUNTER — Ambulatory Visit: Admitting: Internal Medicine

## 2024-03-17 ENCOUNTER — Ambulatory Visit: Admitting: Family Medicine

## 2024-03-17 VITALS — BP 112/70 | HR 84 | Temp 98.4°F | Ht 62.0 in | Wt 120.0 lb

## 2024-03-17 DIAGNOSIS — L723 Sebaceous cyst: Secondary | ICD-10-CM | POA: Diagnosis not present

## 2024-03-17 DIAGNOSIS — L089 Local infection of the skin and subcutaneous tissue, unspecified: Secondary | ICD-10-CM | POA: Insufficient documentation

## 2024-03-17 DIAGNOSIS — F17200 Nicotine dependence, unspecified, uncomplicated: Secondary | ICD-10-CM | POA: Diagnosis not present

## 2024-03-17 DIAGNOSIS — F419 Anxiety disorder, unspecified: Secondary | ICD-10-CM | POA: Diagnosis not present

## 2024-03-17 DIAGNOSIS — I10 Essential (primary) hypertension: Secondary | ICD-10-CM | POA: Diagnosis not present

## 2024-03-17 MED ORDER — DOXYCYCLINE HYCLATE 100 MG PO TABS: 100.0000 mg | ORAL_TABLET | Freq: Two times a day (BID) | ORAL | 0 refills | Status: DC

## 2024-03-17 NOTE — Progress Notes (Signed)
 Patient ID: Christina Hendricks, female   DOB: March 24, 1961, 63 y.o.   MRN: [id number]        Chief Complaint: follow up left axillary infected sebaceious cyst , htn       HPI:  Christina Hendricks is a 63 y.o. female here with c/o 3 days onset moderate sized infected seb cyst to left axilla.  Pt denies high fever, chills .  Pt denies chest pain, increased sob or doe, wheezing, orthopnea, PND, increased LE swelling, palpitations, dizziness or syncope.   Pt denies polydipsia, polyuria, or new focal neuro s/s.  Still smoking, not ready to quit       Wt Readings from Last 3 Encounters:  03/17/24 120 lb (54.4 kg)  04/02/23 125 lb 12.8 oz (57.1 kg)  05/23/22 127 lb (57.6 kg)   BP Readings from Last 3 Encounters:  03/17/24 112/70  04/02/23 120/68  05/23/22 110/68         Past Medical History:  Diagnosis Date   Hypertension    History reviewed. No pertinent surgical history.  reports that she has been smoking cigarettes. She started smoking about 46 years ago. She has a 35.1 pack-year smoking history. She does not have any smokeless tobacco history on file. She reports current alcohol use of about 7.0 standard drinks of alcohol per week. She reports that she does not currently use drugs after having used the following drugs: Marijuana. family history includes Cancer in her father. Allergies  Allergen Reactions   Erythromycin    Current Outpatient Medications on File Prior to Visit  Medication Sig Dispense Refill   amLODipine  (NORVASC ) 10 MG tablet TAKE 1 TABLET BY MOUTH EVERY DAY 90 tablet 0   clonazePAM  (KLONOPIN ) 0.5 MG tablet Take 1 tablet (0.5 mg total) by mouth 2 (two) times daily as needed for anxiety. 20 tablet 1   No current facility-administered medications on file prior to visit.        ROS:  All others reviewed and negative.  Objective        PE:  BP 112/70   Pulse 84   Temp 98.4 F (36.9 C)   Ht 5' 2 (1.575 m)   Wt 120 lb (54.4 kg)   SpO2 98%   BMI 21.95 kg/m                  Constitutional: Pt appears in NAD               HENT: Head: NCAT.                Right Ear: External ear normal.                 Left Ear: External ear normal.                Eyes: . Pupils are equal, round, and reactive to light. Conjunctivae and EOM are normal               Nose: without d/c or deformity               Neck: Neck supple. Gross normal ROM               Cardiovascular: Normal rate and regular rhythm.                 Pulmonary/Chest: Effort normal and breath sounds without rales or wheezing.                Aleft axilla  with 1.5 cm red tender raised sebac cyst lesion without fluctuance or drainage               Neurological: Pt is alert. At baseline orientation, motor grossly intact               Skin: Skin is warm. No rashes, no other new lesions, LE edema - none               Psychiatric: Pt behavior is normal without agitation , mod nervous  Micro: none  Cardiac tracings I have personally interpreted today:  none  Pertinent Radiological findings (summarize): none   Lab Results  Component Value Date   WBC 7.5 04/02/2023   HGB 13.2 04/02/2023   HCT 39.6 04/02/2023   PLT 301.0 04/02/2023   GLUCOSE 91 04/02/2023   CHOL 219 (H) 04/02/2023   TRIG 137.0 04/02/2023   HDL 86.90 04/02/2023   LDLDIRECT 115.0 03/13/2022   LDLCALC 105 (H) 04/02/2023   ALT 23 04/02/2023   AST 25 04/02/2023   NA 139 04/02/2023   K 3.7 04/02/2023   CL 104 04/02/2023   CREATININE 0.63 04/02/2023   BUN 14 04/02/2023   CO2 28 04/02/2023   TSH 0.60 04/02/2023   HGBA1C 5.8 04/02/2023   Assessment/Plan:  Christina Hendricks is a 64 y.o. White or Caucasian [1] female with  has a past medical history of Hypertension.  Infected sebaceous cyst Mild to mod, for antibx course, doxycycline  100 bid, refer to general surgury for removal, to f/u any worsening symptoms or concerns  Essential hypertension BP Readings from Last 3 Encounters:  03/17/24 112/70  04/02/23 120/68  05/23/22 110/68   Stable,  pt to continue medical treatment norvasc  10 qd   Nicotine dependence with current use Pt counsled to quit, pt not ready  Anxiety With mild worsening situational it seems, cont current tx  Followup: Return if symptoms worsen or fail to improve.  Lynwood Rush, MD 03/17/2024 1:24 PM [facility]  Primary Care - [facility]

## 2024-03-17 NOTE — Assessment & Plan Note (Signed)
 With mild worsening situational it seems, cont current tx

## 2024-03-17 NOTE — Patient Instructions (Signed)
Please take all new medication as prescribed - the antibiotic  Please continue all other medications as before, and refills have been done if requested.  Please have the pharmacy call with any other refills you may need.  Please keep your appointments with your specialists as you may have planned  You will be contacted regarding the referral for: General Surgury

## 2024-03-17 NOTE — Assessment & Plan Note (Signed)
 Mild to mod, for antibx course, doxycycline  100 bid, refer to general surgury for removal, to f/u any worsening symptoms or concerns

## 2024-03-17 NOTE — Assessment & Plan Note (Signed)
 BP Readings from Last 3 Encounters:  03/17/24 112/70  04/02/23 120/68  05/23/22 110/68   Stable, pt to continue medical treatment norvasc  10 qd

## 2024-03-17 NOTE — Assessment & Plan Note (Signed)
 Pt counsled to quit, pt not ready

## 2024-03-19 ENCOUNTER — Telehealth: Payer: Self-pay | Admitting: *Deleted

## 2024-03-19 NOTE — Telephone Encounter (Signed)
 Copied from CRM #[id number]. Topic: Clinical - Medical Advice >> Mar 19, 2024  1:19 PM Berneda FALCON wrote: Reason for CRM: Patient saw Dr. Norleen on 8/26 and was prescribed doxycyline but this is not helping. Boil is the size of her thumb and painful, there has been no improvement so far. I did provide her the phone number to the referral he sent in for her but is there anything we can call in for her to help her with the pain and discomfort in the meantime?  Patient callback is [phone]  Feels that Dr. Norleen did not look at it close enough and the medication is not sufficient-needs something stronger-she is having to call off work for this.   [id number] - [place], [place] - [address] AT [facility] [address] [place] [address] Phone: [phone] Fax: [phone] Hours: Open 24 hours

## 2024-03-19 NOTE — Telephone Encounter (Signed)
 Need OV last visit 04/02/2023

## 2024-03-20 NOTE — Telephone Encounter (Addendum)
 Called pt to triage her and see how we can help her.  Pt stated the boil is oozing and painful, the medication that was prescribed did not help her. I advised pt that she needs to be seen and she needs to go to UC or ER, most likely she will need the boil drained and cleaned pretty well to avoid infection.  Pt stated that she won't be able to go today, she will see if she can go sometime this weekend.  Advised her to use warm compressors, keep it clean with warm water, avoid squeezing it and put some OTC antibiotic ointment. Also continue the doxycycline  that dr. Norleen prescribe you.  She agreed to do so. Advised her that is she see that it's not improved to go ahead and go to ER or UC. She voiced understanding.

## 2024-03-21 DIAGNOSIS — L732 Hidradenitis suppurativa: Secondary | ICD-10-CM | POA: Diagnosis not present

## 2024-04-02 ENCOUNTER — Other Ambulatory Visit: Payer: Self-pay | Admitting: Family Medicine

## 2024-04-07 DIAGNOSIS — L723 Sebaceous cyst: Secondary | ICD-10-CM | POA: Diagnosis not present

## 2024-04-26 ENCOUNTER — Telehealth: Admitting: Family

## 2024-04-26 DIAGNOSIS — U071 COVID-19: Secondary | ICD-10-CM

## 2024-04-26 MED ORDER — BENZONATATE 100 MG PO CAPS: 100.0000 mg | ORAL_CAPSULE | Freq: Three times a day (TID) | ORAL | 0 refills | Status: DC | PRN

## 2024-04-26 NOTE — Patient Instructions (Signed)
 COVID-19: What to Know COVID-19 is an infection caused by a virus called SARS-CoV-2. This type of virus is called a coronavirus. People with COVID-19 may: Have few to no symptoms. Have mild to moderate symptoms that affect their lungs and breathing. Get very sick. What are the causes?  COVID-19 is caused by a virus. This virus may be in the air as droplets or on surfaces. It can spread from an infected person when they cough, sneeze, speak, sing, or breathe. You may become infected if: You breathe in the infected droplets in the air. You touch an object that has the virus on it. What increases the risk? You are at risk of getting COVID-19 if you have been around someone with the infection. You may be more likely to get very sick if: You are 57 years old or older. You have certain medical conditions, such as: Heart disease. Diabetes. Long-term respiratory disease. Cancer. Pregnancy. You are immunocompromised. This means your body can't fight infections easily. You have a disability that makes it hard for you to move around, you have trouble moving, or you can't move at all. What are the signs or symptoms? People may have different symptoms from COVID-19. The symptoms can also be mild to very bad. They often show up in 5-6 days after being infected. But, they can take up to 14 days to appear. Common symptoms are: Cough. Feeling tired. New loss of taste or smell. Fever. Less common symptoms are: Sore throat. Headache. Body or muscle aches. Diarrhea. A skin rash or fingers or toes that are a different color than usual. Red or irritated eyes. Sometimes, COVID-19 does not cause symptoms. How is this diagnosed? COVID-19 can be diagnosed with tests done in the lab or at home. Fluid from your nose, mouth, or lungs will be used to check for the virus. How is this treated? Treatment for COVID-19 depends on how sick you are. Mild symptoms can be treated at home with rest, fluids, and  over-the-counter medicines. very bad symptoms may be treated in a hospital intensive care unit (ICU). If you have symptoms and are at risk of getting very sick, you may be given a medicine that fights viruses. This medicine is called an antiviral. How is this prevented? To protect yourself from COVID-19: Know your risk factors. Get vaccinated. If your body can't fight infections easily, talk to your provider about treatment to help prevent COVID-19. Stay at least about 3 feet (1 meter) away from other people. Wear mask that fits well when: You can't stay at a distance from people. You're in a place with not a lot of air flow. Try to be in open spaces with good air flow when you are in public. Wash your hands often or use an alcohol-based hand sanitizer. Cover your nose and mouth when you cough or sneeze. If you think you have COVID-19 or have been around someone who has it, stay home and away from other people as told by your provider or health officials. Where to find more information To learn more: Go to [employer]erPromos.no Click Health Topics. Type COVID-19 in the search box. Go to VisitDestination.com.br Click Health Topics. Then click All Topics. Type COVID-19 in the search box. Get help right away if: You have trouble breathing or get short of breath. You have pain or pressure in your chest. You're feeling confused. These symptoms may be an emergency. Get help right away. Call 911. Do not wait to see if the symptoms will go away.  Do not drive yourself to the hospital. This information is not intended to replace advice given to you by your health care provider. Make sure you discuss any questions you have with your health care provider. Document Revised: 04/11/2023 Document Reviewed: 04/03/2023 Elsevier Patient Education  2025 [employer].

## 2024-04-26 NOTE — Progress Notes (Signed)
 Virtual Visit Consent   Christina Hendricks, you are scheduled for a virtual visit with a [facility] provider today. Just as with appointments in the office, your consent must be obtained to participate. Your consent will be active for this visit and any virtual visit you may have with one of our providers in the next 365 days. If you have a MyChart account, a copy of this consent can be sent to you electronically.  As this is a virtual visit, video technology does not allow for your provider to perform a traditional examination. This may limit your provider's ability to fully assess your condition. If your provider identifies any concerns that need to be evaluated in person or the need to arrange testing (such as labs, EKG, etc.), we will make arrangements to do so. Although advances in technology are sophisticated, we cannot ensure that it will always work on either your end or our end. If the connection with a video visit is poor, the visit may have to be switched to a telephone visit. With either a video or telephone visit, we are not always able to ensure that we have a secure connection.  By engaging in this virtual visit, you consent to the provision of healthcare and authorize for your insurance to be billed (if applicable) for the services provided during this visit. Depending on your insurance coverage, you may receive a charge related to this service.  I need to obtain your verbal consent now. Are you willing to proceed with your visit today? [name] has provided verbal consent on 04/26/2024 for a virtual visit (video or telephone). Bari Learn, FNP  Date: 04/26/2024 9:06 AM   Virtual Visit via Video Note   I, Bari Learn, connected with  Christina Hendricks  ([id number], Oct 04, 1960) on 04/26/24 at  9:00 AM EDT by a video-enabled telemedicine application and verified that I am speaking with the correct person using two identifiers.  Location: Patient: Virtual Visit Location Patient:  Home Provider: Virtual Visit Location Provider: Home Office   I discussed the limitations of evaluation and management by telemedicine and the availability of in person appointments. The patient expressed understanding and agreed to proceed.    History of Present Illness: Christina Hendricks is a 63 y.o. who identifies as a female who was assigned female at birth, and is being seen today for COVID. Reports her symptoms started two days ago and tested positive this AM.  HPI: URI  This is a new problem. The current episode started in the past 7 days. The problem has been unchanged. There has been no fever. Associated symptoms include congestion, joint pain, rhinorrhea and sneezing. Pertinent negatives include no coughing, ear pain, headaches, nausea, sinus pain, sore throat or vomiting. Treatments tried: motrin. The treatment provided mild relief.    Problems:  Patient Active Problem List   Diagnosis Date Noted   Infected sebaceous cyst 03/17/2024   Sun-damaged skin 03/13/2022   Anxiety 11/18/2020   Dyslipidemia 05/19/2020   Essential hypertension 05/16/2020   Nicotine dependence with current use 05/16/2020    Allergies:  Allergies  Allergen Reactions   Erythromycin    Medications:  Current Outpatient Medications:    benzonatate  (TESSALON  PERLES) 100 MG capsule, Take 1 capsule (100 mg total) by mouth 3 (three) times daily as needed., Disp: 20 capsule, Rfl: 0   amLODipine  (NORVASC ) 10 MG tablet, TAKE 1 TABLET BY MOUTH EVERY DAY, Disp: 30 tablet, Rfl: 0   clonazePAM  (KLONOPIN ) 0.5 MG tablet, Take 1 tablet (0.5  mg total) by mouth 2 (two) times daily as needed for anxiety., Disp: 20 tablet, Rfl: 1  Observations/Objective: Patient is well-developed, well-nourished in no acute distress.  Resting comfortably  at home.  Head is normocephalic, atraumatic.  No labored breathing.  Speech is clear and coherent with logical content.  Patient is alert and oriented at baseline.  Nasal congestion    Assessment and Plan: 1. COVID-19 (Primary) - benzonatate  (TESSALON  PERLES) 100 MG capsule; Take 1 capsule (100 mg total) by mouth 3 (three) times daily as needed.  Dispense: 20 capsule; Refill: 0  COVID positive, rest, force fluids, tylenol as needed,  report any worsening symptoms such as increased shortness of breath, swelling, or continued high fevers. Possible adverse effects discussed with antivirals, will hold off antiviral at this time.    Follow Up Instructions: I discussed the assessment and treatment plan with the patient. The patient was provided an opportunity to ask questions and all were answered. The patient agreed with the plan and demonstrated an understanding of the instructions.  A copy of instructions were sent to the patient via MyChart unless otherwise noted below.    The patient was advised to call back or seek an in-person evaluation if the symptoms worsen or if the condition fails to improve as anticipated.    Bari Learn, FNP

## 2024-04-27 ENCOUNTER — Telehealth: Payer: Self-pay | Admitting: Family Medicine

## 2024-04-27 NOTE — Telephone Encounter (Signed)
 Noted

## 2024-04-27 NOTE — Telephone Encounter (Signed)
 Was seen by virtual visit on 04/26/24  Patient Name First: Christina Last: Hendricks Gender: Female DOB: [birth year] Age: 63 Y 7 M 28 D Return Phone Number: [phone] (Primary) Address: City/ State/ Zip: [place]  [address] Client [facility] - Human [facility] [employer] Provider Kennyth Bart- MD Contact Type Call Who Is Calling Patient / Member / Family / Caregiver Call Type Triage / Clinical Relationship To Patient Self Return Phone Number [phone] (Primary) Chief Complaint Cold Symptom Reason for Call Symptomatic / Request for Health Information Initial Comment Caller states she tested positive for Covid, experiencing cold symptoms. Translation No Nurse Assessment Nurse: Rumalda, RN, Margarie Date/Time Titus Time): 04/25/2024 3:17:48 PM Confirm and document reason for call. If symptomatic, describe symptoms. ---Caller states that she just tested positive for COVID, at home test. Caller states that she has colonscopy scheduled on Tuesday. Caller states she is experiencing no taste, congestion, no cough. Does the patient have any new or worsening symptoms? ---Yes Will a triage be completed? ---Yes Related visit to physician within the last 2 weeks? ---No Does the PT have any chronic conditions? (i.e. diabetes, asthma, this includes High risk factors for pregnancy, etc.) ---Yes List chronic conditions. ---HTN Is this a behavioral health or substance abuse call? ---No Guidelines Guideline Title Affirmed Question Affirmed Notes Nurse Date/Time (Eastern Time) COVID-19 - Diagnosed or Suspected MILD difficulty breathing (e.g., minimal/no SOB at rest, SOB with walking, pulse < 100) Rose, RN, Margarie 04/25/2024 3:20:33 PM  Disp. Time Titus Time) Disposition Final User 04/25/2024 2:58:05 PM Send to Clinical Louis Muzzy, RN, Dorothyann 04/25/2024 3:27:41 PM See HCP (or PCP Triage) Within  4 Hours Yes Rumalda, RN, Margarie Final Disposition 04/25/2024 3:27:41 PM See HCP (or PCP Triage) Within 4 Hours Yes Rumalda, RN, Margarie Caller Disagree/Comply Comply Caller Understands Yes PreDisposition Home Care Care Advice Given Per Guideline GENERAL CARE ADVICE FOR COVID-19 SYMPTOMS: * Muscle aches, headache, and other pains: Often this comes and goes with the fever. Take acetaminophen every 4 to 6 hours (Adults 650 mg) OR ibuprofen every 6 to 8 hours (Adults 400 mg). Before taking any medicine, read all the instructions on the package. WEAR A MASK - COVER YOUR MOUTH AND NOSE: * If you are around others, wear a mask that fits snuggly over your mouth and nose. * Feeling dehydrated: Drink extra liquids. If the air in your home is dry, use a humidifier. * Fever, Chills, and Sweats: For fever over 101 F (38.3 C), take acetaminophen every 4 to 6 hours (Adults 650 mg) OR ibuprofen every 6 to 8 hours (Adults 400 mg). Before taking any medicine, read all the instructions on the package. Do not take aspirin unless your doctor has prescribed it for you. Chills can sometimes come before a fever. You may feel cold in your hands and feet. You may have shivering. You may also feel sweaty as your body temperature goes down. * The symptoms are generally treated the same whether you have COVID-19, influenza or some other respiratory virus. CALL BACK IF: * You become worse CARE ADVICE given per COVID-19 - DIAGNOSED OR SUSPECTED (Adult) guideline. After Care Instructions Given Call Event Type User Date / Time Description Education document email Rumalda OBIE Margarie 04/25/2024 3:29:57 PM COVID-19 Diagnosed or Suspected Education document email Rumalda OBIE Margarie 04/25/2024 3:30:35 PM Jolynn [facility] Connect Now Instructions Referrals GO TO FACILITY UNDECIDED

## 2024-05-01 ENCOUNTER — Other Ambulatory Visit: Payer: Self-pay | Admitting: Family Medicine

## 2024-05-14 ENCOUNTER — Telehealth: Payer: Self-pay | Admitting: *Deleted

## 2024-05-14 ENCOUNTER — Other Ambulatory Visit: Payer: Self-pay | Admitting: *Deleted

## 2024-05-14 DIAGNOSIS — Z87891 Personal history of nicotine dependence: Secondary | ICD-10-CM

## 2024-05-14 NOTE — Telephone Encounter (Signed)
 Copied from CRM [id number]. Topic: General - Other >> May 14, 2024 11:08 AM Christina Hendricks wrote: Reason for CRM: Patient called in regarding wanting to know if she is due for a mammogram and pulmonary test, would like a callback regarding this  Spoke with patient pulmonary test order placed  Mammogram due on or around 02/2025 Verbalized understanding

## 2024-05-20 DIAGNOSIS — K648 Other hemorrhoids: Secondary | ICD-10-CM | POA: Diagnosis not present

## 2024-05-20 DIAGNOSIS — Z800 Family history of malignant neoplasm of digestive organs: Secondary | ICD-10-CM | POA: Diagnosis not present

## 2024-05-20 DIAGNOSIS — Z860101 Personal history of adenomatous and serrated colon polyps: Secondary | ICD-10-CM | POA: Diagnosis not present

## 2024-05-20 DIAGNOSIS — Z09 Encounter for follow-up examination after completed treatment for conditions other than malignant neoplasm: Secondary | ICD-10-CM | POA: Diagnosis not present

## 2024-05-20 DIAGNOSIS — K5730 Diverticulosis of large intestine without perforation or abscess without bleeding: Secondary | ICD-10-CM | POA: Diagnosis not present

## 2024-05-20 LAB — HM COLONOSCOPY

## 2024-05-21 DIAGNOSIS — L570 Actinic keratosis: Secondary | ICD-10-CM | POA: Diagnosis not present

## 2024-05-21 DIAGNOSIS — D2272 Melanocytic nevi of left lower limb, including hip: Secondary | ICD-10-CM | POA: Diagnosis not present

## 2024-05-21 DIAGNOSIS — Z85828 Personal history of other malignant neoplasm of skin: Secondary | ICD-10-CM | POA: Diagnosis not present

## 2024-05-21 DIAGNOSIS — L814 Other melanin hyperpigmentation: Secondary | ICD-10-CM | POA: Diagnosis not present

## 2024-05-21 DIAGNOSIS — L218 Other seborrheic dermatitis: Secondary | ICD-10-CM | POA: Diagnosis not present

## 2024-05-21 DIAGNOSIS — L820 Inflamed seborrheic keratosis: Secondary | ICD-10-CM | POA: Diagnosis not present

## 2024-06-03 ENCOUNTER — Telehealth: Payer: Self-pay | Admitting: Acute Care

## 2024-06-03 NOTE — Telephone Encounter (Signed)
 Received VM for pt to schedule her annual LDCT. Called and left VM for pt.

## 2024-06-05 ENCOUNTER — Other Ambulatory Visit: Payer: Self-pay | Admitting: Family Medicine

## 2024-06-09 ENCOUNTER — Other Ambulatory Visit: Payer: Self-pay | Admitting: Family Medicine

## 2024-06-16 NOTE — Telephone Encounter (Signed)
 Pt schedled for 12/9 at [facility].  Nothing further needed at this time.

## 2024-06-30 ENCOUNTER — Inpatient Hospital Stay: Admission: RE | Admit: 2024-06-30 | Discharge: 2024-06-30 | Attending: Acute Care | Admitting: Acute Care

## 2024-06-30 DIAGNOSIS — F17210 Nicotine dependence, cigarettes, uncomplicated: Secondary | ICD-10-CM

## 2024-06-30 DIAGNOSIS — Z122 Encounter for screening for malignant neoplasm of respiratory organs: Secondary | ICD-10-CM

## 2024-06-30 DIAGNOSIS — Z87891 Personal history of nicotine dependence: Secondary | ICD-10-CM

## 2024-07-02 ENCOUNTER — Other Ambulatory Visit: Payer: Self-pay

## 2024-07-02 DIAGNOSIS — F17210 Nicotine dependence, cigarettes, uncomplicated: Secondary | ICD-10-CM

## 2024-07-02 DIAGNOSIS — Z87891 Personal history of nicotine dependence: Secondary | ICD-10-CM

## 2024-07-02 DIAGNOSIS — Z122 Encounter for screening for malignant neoplasm of respiratory organs: Secondary | ICD-10-CM

## 2024-07-03 ENCOUNTER — Other Ambulatory Visit: Payer: Self-pay | Admitting: Family Medicine

## 2024-07-06 ENCOUNTER — Telehealth: Payer: Self-pay

## 2024-07-06 NOTE — Telephone Encounter (Signed)
 Copied from CRM #[id number]. Topic: Clinical - Prescription Issue >> Jul 03, 2024  1:06 PM Anairis L wrote: Reason for CRM: Patient is wanting to know why her amLODIPine  Besylate 10 mg Oral Daily 3 month supply was denied.   Please advise.  Returned pt call and lvm advising in order for refills pt will need to schedule an OV with PCP. Advised on vm office cb number to call and schedule a medication refill appt and any other questions or concerns.

## 2024-07-06 NOTE — Telephone Encounter (Signed)
 Patient returned call; advised that she called for 90 day supply and upset stating last time she had appt she was 11 mins late and was refused being seen. Told pt that at last refill it was advised she will need appt for future refills since last visit was Sept of 2024. Pt admits it's ridiculous to have to come into the office for a medication she has been on for years. Spoke with pt and empathized letting her know office/provider policy needing to be seen at a minimum of once yearly for medication refills. Advised would be happy to help set up an appointment and pt refused. Pt hung up phone.

## 2024-07-07 ENCOUNTER — Other Ambulatory Visit: Payer: Self-pay | Admitting: Family Medicine

## 2024-07-13 ENCOUNTER — Other Ambulatory Visit: Payer: Self-pay

## 2024-07-13 ENCOUNTER — Telehealth: Payer: Self-pay

## 2024-07-13 MED ORDER — AMLODIPINE BESYLATE 10 MG PO TABS: 10.0000 mg | ORAL_TABLET | Freq: Every day | ORAL | 0 refills | Status: DC

## 2024-07-13 NOTE — Telephone Encounter (Signed)
 Copied from CRM #[id number]. Topic: Clinical - Medication Question >> Jul 10, 2024  3:17 PM [name] wrote: Reason for CRM: Patient scheduled appt for 07/28/24 and wants to know if her amLODipine  (NORVASC ) 10 MG tablet will be refilled? Please call her at  [phone]  Patient Rx refill sent to pharmacy and advised needs OV for future refills. Pt appt is already scheduled for July 28 2024

## 2024-07-14 ENCOUNTER — Ambulatory Visit: Admitting: Physician Assistant

## 2024-07-28 ENCOUNTER — Encounter: Payer: Self-pay | Admitting: Family Medicine

## 2024-07-28 ENCOUNTER — Ambulatory Visit: Admitting: Family Medicine

## 2024-07-28 VITALS — BP 112/64 | HR 90 | Temp 97.9°F | Ht 62.0 in | Wt 118.4 lb

## 2024-07-28 DIAGNOSIS — F17200 Nicotine dependence, unspecified, uncomplicated: Secondary | ICD-10-CM

## 2024-07-28 DIAGNOSIS — I10 Essential (primary) hypertension: Secondary | ICD-10-CM

## 2024-07-28 DIAGNOSIS — E785 Hyperlipidemia, unspecified: Secondary | ICD-10-CM

## 2024-07-28 DIAGNOSIS — F419 Anxiety disorder, unspecified: Secondary | ICD-10-CM | POA: Diagnosis not present

## 2024-07-28 DIAGNOSIS — Z23 Encounter for immunization: Secondary | ICD-10-CM

## 2024-07-28 LAB — COMPREHENSIVE METABOLIC PANEL WITH GFR
ALT: 19 U/L (ref 3–35)
AST: 21 U/L (ref 5–37)
Albumin: 4.3 g/dL (ref 3.5–5.2)
Alkaline Phosphatase: 65 U/L (ref 39–117)
BUN: 17 mg/dL (ref 6–23)
CO2: 28 meq/L (ref 19–32)
Calcium: 9.3 mg/dL (ref 8.4–10.5)
Chloride: 102 meq/L (ref 96–112)
Creatinine, Ser: 0.7 mg/dL (ref 0.40–1.20)
GFR: 93.02 mL/min
Glucose, Bld: 124 mg/dL — ABNORMAL HIGH (ref 70–99)
Potassium: 4 meq/L (ref 3.5–5.1)
Sodium: 137 meq/L (ref 135–145)
Total Bilirubin: 0.5 mg/dL (ref 0.2–1.2)
Total Protein: 6.8 g/dL (ref 6.0–8.3)

## 2024-07-28 LAB — TSH: TSH: 0.66 u[IU]/mL (ref 0.35–5.50)

## 2024-07-28 LAB — LIPID PANEL
Cholesterol: 198 mg/dL (ref 28–200)
HDL: 76.9 mg/dL
LDL Cholesterol: 101 mg/dL — ABNORMAL HIGH (ref 10–99)
NonHDL: 120.66
Total CHOL/HDL Ratio: 3
Triglycerides: 96 mg/dL (ref 10.0–149.0)
VLDL: 19.2 mg/dL (ref 0.0–40.0)

## 2024-07-28 LAB — HEMOGLOBIN A1C: Hgb A1c MFr Bld: 5.8 % (ref 4.6–6.5)

## 2024-07-28 LAB — CBC
HCT: 40.6 % (ref 36.0–46.0)
Hemoglobin: 13.6 g/dL (ref 12.0–15.0)
MCHC: 33.5 g/dL (ref 30.0–36.0)
MCV: 94 fl (ref 78.0–100.0)
Platelets: 280 K/uL (ref 150.0–400.0)
RBC: 4.32 Mil/uL (ref 3.87–5.11)
RDW: 13.1 % (ref 11.5–15.5)
WBC: 7.9 K/uL (ref 4.0–10.5)

## 2024-07-28 MED ORDER — AMLODIPINE BESYLATE 10 MG PO TABS: 10.0000 mg | ORAL_TABLET | Freq: Every day | ORAL | 0 refills | Status: DC

## 2024-07-28 MED ORDER — BUPROPION HCL ER (XL) 150 MG PO TB24: 150.0000 mg | ORAL_TABLET | Freq: Every day | ORAL | 3 refills | Status: AC

## 2024-07-28 MED ORDER — AMLODIPINE BESYLATE 10 MG PO TABS: 10.0000 mg | ORAL_TABLET | Freq: Every day | ORAL | 3 refills | Status: AC

## 2024-07-28 MED ORDER — CLONAZEPAM 0.5 MG PO TABS: 0.5000 mg | ORAL_TABLET | Freq: Two times a day (BID) | ORAL | 1 refills | Status: AC | PRN

## 2024-07-28 NOTE — Addendum Note (Signed)
 Addended by: IDA ELORA HERO on: 07/28/2024 03:21 PM   Modules accepted: Orders

## 2024-07-28 NOTE — Assessment & Plan Note (Signed)
 Patient still with significant amount of anxiety especially around the aging process.  She has seen therapy in the past but did not find it effective.  We did discuss management strategies.  She is not interested in another referral at this point.  We are starting Wellbutrin  as below for her nicotine dependence which should hopefully help with this as well.  She will let us  know if she changes her mind about referral.  May consider trial of SSRI at some point the future depending on response to above.

## 2024-07-28 NOTE — Assessment & Plan Note (Signed)
 Patient was asked about her tobacco use today and was strongly advised to quit. Patient is currently actively trying to quit. We reviewed treatment options to assist her quit smoking including NRT, Chantix, and Bupropion .  Will start bupropion  follow up at next office visit.   Total time spent counseling approximately 5 minutes.

## 2024-07-28 NOTE — Progress Notes (Signed)
 "  Christina Hendricks is a 64 y.o. female who presents today for an office visit.  Assessment/Plan:  Chronic Problems Addressed Today: Essential hypertension Blood pressure at goal today on amlodipine  10 mg daily.  Will refill today.  Check labs.  Anxiety Patient still with significant amount of anxiety especially around the aging process.  She has seen therapy in the past but did not find it effective.  We did discuss management strategies.  She is not interested in another referral at this point.  We are starting Wellbutrin  as below for her nicotine dependence which should hopefully help with this as well.  She will let us  know if she changes her mind about referral.  May consider trial of SSRI at some point the future depending on response to above.  Dyslipidemia Check lipids.  Nicotine dependence with current use Patient was asked about her tobacco use today and was strongly advised to quit. Patient is currently actively trying to quit. We reviewed treatment options to assist her quit smoking including NRT, Chantix, and Bupropion .  Will start bupropion  follow up at next office visit.   Total time spent counseling approximately 5 minutes.   Preventative Healthcare Prevnar 20 and shingrix  given today. Check labs.     Subjective:  HPI:  See assessment / plan for status of chronic conditions.   Discussed the use of AI scribe software for clinical note transcription with the patient, who gave verbal consent to proceed.  History of Present Illness Christina Hendricks is a 64 year old female who presents for medication refills and smoking cessation support.  She is seeking support for smoking cessation, wanting to decrease smoking, if not quit entirely, as she is about to become a grandmother. She describes her smoking as more of a habit than a nicotine addiction and has been using tar stoppers to reduce her intake. She is trying to cut back gradually but finds it challenging.  She is  currently taking amlodipine , which she needs refilled, and clonazepam , which she uses primarily when traveling. She is planning a trip to [place] and notes that her current supply is expired.  She has received a flu shot recently. She had COVID-19 in November and has not had a recent COVID-19 vaccine. She had the first dose of Shingrix  in 2021.  She experiences anxiety about aging and death, exacerbated by her frequent visits to her 58 year old mother at [place], where she is exposed to many elderly individuals and end-of-life situations. She is almost 40 and feels young but is increasingly aware of aging signs.  She undergoes annual lung cancer screenings and remains concerned due to her smoking history. She also mentions a recent colonoscopy that was costly and is considering checking with her insurance about coverage.  She continues to see a dermatologist for skin cancer management, having had several lesions frozen or removed. She attributes her skin issues to past sun exposure from living in [place] .         Objective:  Physical Exam: BP 112/64   Pulse 90   Temp 97.9 F (36.6 C) (Temporal)   Ht 5' 2 (1.575 m)   Wt 118 lb 6.4 oz (53.7 kg)   SpO2 99%   BMI 21.66 kg/m   Gen: No acute distress, resting comfortably CV: Regular rate and rhythm with no murmurs appreciated Pulm: Normal work of breathing, clear to auscultation bilaterally with no crackles, wheezes, or rhonchi Neuro: Grossly normal, moves all extremities Psych: Normal affect and thought content  Worth HERO. Kennyth, MD 07/28/2024 10:56 AM  "

## 2024-07-28 NOTE — Patient Instructions (Signed)
 It was very nice to see you today!  VISIT SUMMARY: Today, we discussed your desire to quit smoking, managed your medications, and addressed your anxiety about aging. We also reviewed your ongoing skin cancer management and updated your vaccinations.  YOUR PLAN: NICOTINE DEPENDENCE, CIGARETTES: You have a chronic nicotine dependence and want to reduce or quit smoking. -Start taking Wellbutrin  as prescribed to help reduce cravings and habitual smoking behaviors.  ESSENTIAL HYPERTENSION: You have chronic high blood pressure managed with medication. -Continue taking amlodipine  as prescribed.  ANXIETY DISORDER: You experience anxiety related to aging and death. -Start taking Wellbutrin  as prescribed to help manage anxiety symptoms.  SKIN CANCER: You are managing skin cancer with your dermatologist. -Continue regular follow-ups with your dermatologist for skin cancer management.  GENERAL HEALTH MAINTENANCE: You are due for routine vaccinations and blood work. -You received the pneumonia and shingles vaccines today. -Blood work has been ordered; please complete it as instructed.  Return in about 1 year (around 07/28/2025) for Annual Physical.   Take care, Dr Kennyth  PLEASE NOTE:  If you had any lab tests, please let us  know if you have not heard back within a few days. You may see your results on mychart before we have a chance to review them but we will give you a call once they are reviewed by us .   If we ordered any referrals today, please let us  know if you have not heard from their office within the next week.   If you had any urgent prescriptions sent in today, please check with the pharmacy within an hour of our visit to make sure the prescription was transmitted appropriately.   Please try these tips to maintain a healthy lifestyle:  Eat at least 3 REAL meals and 1-2 snacks per day.  Aim for no more than 5 hours between eating.  If you eat breakfast, please do so within one hour  of getting up.   Each meal should contain half fruits/vegetables, one quarter protein, and one quarter carbs (no bigger than a computer mouse)  Cut down on sweet beverages. This includes juice, soda, and sweet tea.   Drink at least 1 glass of water with each meal and aim for at least 8 glasses per day  Exercise at least 150 minutes every week.

## 2024-07-28 NOTE — Assessment & Plan Note (Signed)
 Check lipids

## 2024-07-28 NOTE — Assessment & Plan Note (Signed)
 Blood pressure at goal today on amlodipine  10 mg daily.  Will refill today.  Check labs.

## 2024-07-30 ENCOUNTER — Ambulatory Visit: Payer: Self-pay | Admitting: Family Medicine

## 2024-07-30 NOTE — Progress Notes (Signed)
 Her blood sugar and bad cholesterol are both borderline but similar to last year.  She does not need to start meds for either of these however it is important that she continue to work on diet and exercise.  All of her other labs are at goal.  We can recheck everything in a year.
